# Patient Record
Sex: Male | Born: 1980 | Race: Black or African American | Hispanic: No | Marital: Single | Smoking: Current every day smoker
Health system: Southern US, Community
[De-identification: ages and names within clinical notes are randomized; demographics above are authoritative.]

## PROBLEM LIST (undated history)

## (undated) DIAGNOSIS — I219 Acute myocardial infarction, unspecified: Secondary | ICD-10-CM

## (undated) DIAGNOSIS — W3400XA Accidental discharge from unspecified firearms or gun, initial encounter: Secondary | ICD-10-CM

## (undated) HISTORY — PX: OTHER SURGICAL HISTORY: SHX169

---

## 2010-03-20 ENCOUNTER — Emergency Department (HOSPITAL_COMMUNITY): Admission: AC | Admit: 2010-03-20 | Discharge: 2010-03-20 | Payer: Self-pay

## 2010-07-26 LAB — CBC
HCT: 43 % (ref 39.0–52.0)
Hemoglobin: 14.6 g/dL (ref 13.0–17.0)
MCH: 30.8 pg (ref 26.0–34.0)
MCHC: 34 g/dL (ref 30.0–36.0)
MCV: 90.7 fL (ref 78.0–100.0)
Platelets: 223 10*3/uL (ref 150–400)
RBC: 4.74 MIL/uL (ref 4.22–5.81)
RDW: 13.2 % (ref 11.5–15.5)
WBC: 11.8 10*3/uL — ABNORMAL HIGH (ref 4.0–10.5)

## 2010-07-26 LAB — COMPREHENSIVE METABOLIC PANEL
ALT: 15 U/L (ref 0–53)
AST: 21 U/L (ref 0–37)
Albumin: 4.1 g/dL (ref 3.5–5.2)
Alkaline Phosphatase: 70 U/L (ref 39–117)
BUN: 7 mg/dL (ref 6–23)
Chloride: 106 mEq/L (ref 96–112)
GFR calc Af Amer: >60 mL/min (ref >60)
Potassium: 3.7 mEq/L (ref 3.5–5.1)
Sodium: 139 mEq/L (ref 135–145)
Total Bilirubin: 0.6 mg/dL (ref 0.3–1.2)
Total Protein: 7.5 g/dL (ref 6.0–8.3)

## 2010-07-26 LAB — POCT I-STAT, CHEM 8
BUN: 8 mg/dL (ref 6–23)
Calcium, Ion: 1.05 mmol/L — ABNORMAL LOW (ref 1.12–1.32)
Chloride: 106 meq/L (ref 96–112)
Glucose, Bld: 93 mg/dL (ref 70–99)
HCT: 48 % (ref 39.0–52.0)
Potassium: 3.8 meq/L (ref 3.5–5.1)

## 2010-07-26 LAB — COMPREHENSIVE METABOLIC PANEL WITH GFR
CO2: 26 meq/L (ref 19–32)
Calcium: 9.3 mg/dL (ref 8.4–10.5)
Creatinine, Ser: 0.91 mg/dL (ref 0.4–1.5)
GFR calc non Af Amer: >60 mL/min (ref >60)
Glucose, Bld: 95 mg/dL (ref 70–99)

## 2010-07-26 LAB — PROTIME-INR
INR: 0.92 (ref 0.00–1.49)
Prothrombin Time: 12.6 s (ref 11.6–15.2)

## 2010-07-26 LAB — SAMPLE TO BLOOD BANK

## 2011-10-08 ENCOUNTER — Emergency Department (HOSPITAL_COMMUNITY)
Admission: EM | Admit: 2011-10-08 | Discharge: 2011-10-08 | Disposition: A | Discharge status: Home Health | Payer: Self-pay | Attending: Emergency Medicine | Admitting: Emergency Medicine

## 2011-10-08 ENCOUNTER — Emergency Department (HOSPITAL_COMMUNITY): Payer: Self-pay

## 2011-10-08 ENCOUNTER — Encounter (HOSPITAL_COMMUNITY): Payer: Self-pay | Admitting: Family Medicine

## 2011-10-08 DIAGNOSIS — J189 Pneumonia, unspecified organism: Secondary | ICD-10-CM | POA: Insufficient documentation

## 2011-10-08 DIAGNOSIS — M549 Dorsalgia, unspecified: Secondary | ICD-10-CM | POA: Insufficient documentation

## 2011-10-08 LAB — COMPREHENSIVE METABOLIC PANEL
Alkaline Phosphatase: 65 U/L (ref 39–117)
BUN: 7 mg/dL (ref 6–23)
Chloride: 102 mEq/L (ref 96–112)
Creatinine, Ser: 0.73 mg/dL (ref 0.50–1.35)
GFR calc Af Amer: >90 mL/min (ref >90)
Glucose, Bld: 80 mg/dL (ref 70–99)
Potassium: 4.6 mEq/L (ref 3.5–5.1)
Total Bilirubin: 0.4 mg/dL (ref 0.3–1.2)
Total Protein: 7.6 g/dL (ref 6.0–8.3)

## 2011-10-08 LAB — LIPASE, BLOOD: Lipase: 22 U/L (ref 11–59)

## 2011-10-08 LAB — CBC
HCT: 43.4 % (ref 39.0–52.0)
Hemoglobin: 15 g/dL (ref 13.0–17.0)
MCHC: 34.6 g/dL (ref 30.0–36.0)
MCV: 90.4 fL (ref 78.0–100.0)
RDW: 12.5 % (ref 11.5–15.5)

## 2011-10-08 MED ORDER — HYDROCODONE-ACETAMINOPHEN 5-325 MG PO TABS: 1.0000 | ORAL_TABLET | ORAL | Status: AC | PRN

## 2011-10-08 MED ORDER — SODIUM CHLORIDE 0.9 % IV BOLUS (SEPSIS)
1000.0000 mL | Freq: Once | INTRAVENOUS | Status: AC
  Administered 2011-10-08: 1000 mL via INTRAVENOUS

## 2011-10-08 MED ORDER — AZITHROMYCIN 250 MG PO TABS
500.0000 mg | ORAL_TABLET | Freq: Once | ORAL | Status: AC
  Administered 2011-10-08: 500 mg via ORAL
  Filled 2011-10-08: qty 2

## 2011-10-08 MED ORDER — HYDROMORPHONE HCL PF 1 MG/ML IJ SOLN
1.0000 mg | Freq: Once | INTRAMUSCULAR | Status: AC
  Administered 2011-10-08: 1 mg via INTRAVENOUS
  Filled 2011-10-08: qty 1

## 2011-10-08 MED ORDER — AZITHROMYCIN 250 MG PO TABS: 250.0000 mg | ORAL_TABLET | Freq: Every day | ORAL | Status: AC

## 2011-10-08 MED ORDER — KETOROLAC TROMETHAMINE 30 MG/ML IJ SOLN: 30.0000 mg | Freq: Once | INTRAMUSCULAR | Status: DC

## 2011-10-08 NOTE — ED Notes (Signed)
Report given to Zina, RN.

## 2011-10-08 NOTE — Discharge Instructions (Signed)
Your x-ray showed that you likely have pneumonia. You have been given an antibiotic to treat this. Your back pain may be coming from the pneumonia in her chest. You've been given a pain medication to take at home. Ensure to drink plenty of fluids over the next several days. You can alternate Tylenol and Motrin every 4 hours as needed for fever. If you have a high fever not controlled by medication, worsening pain, shortness of breath, or chest pain, or any other worrisome symptoms, please return to the emergency department.  RESOURCE GUIDE  Dental Problems  Patients with Medicaid: [facility] [phone] [address]                                           [phone] Phone:  [phone]                                                  Phone:  515-664-8672  If unable to pay or uninsured, contact:  [facility] or [facility]. to become qualified for the adult dental clinic.  Chronic Pain Problems Contact [facility]  [phone] Patients need to be referred by their primary care doctor.  Insufficient Money for Medicine Contact [employer]:  call "211" or [facility] [phone].  No Primary Care Doctor Call [facility]  820-711-6794 Other agencies that provide inexpensive medical care    [facility]  936-077-9344    [facility]  [phone]    [facility]  7143097961    [facility]  769-620-9888    [facility]  ([phone]    [facility]  [phone]  Psychological Services [facility]  [phone] [facility] Services  9370622371 [facility] Mental [facility]   [phone] (emergency services 6188200614)  Substance Abuse Resources [facility]  947-583-7429 [facility] (989) 289-1886 [facility] 5518174510 Floydene Flock 740-710-9027 Residential & Outpatient Substance Abuse Program   (647)052-9309  Abuse/Neglect [facility] Child Abuse Hotline 251-226-7245 [facility] Child Abuse Hotline 920-051-2841 (After Hours)  Emergency Shelter [facility] 213-398-3085  [facility] (725)707-5346 [facility] 432-677-5533  MRSA Hotline #:   626-044-2075    [facility] Resources  [facility]     [employer]                          [facility] Dept. [address] [place]                       [address]      [address]  Patrecia Pace  Michell Heinrich Phone:  [phone]                                   Phone:  [phone]                 Phone:  ([phone]  [facility] Mental Health Phone:  725-718-1414  [facility] Child Abuse Hotline (907)862-0842 (506)822-2252 (After Hours)Pneumonia, Adult Pneumonia is an infection of the lungs.  CAUSES Pneumonia may be caused by bacteria or a virus. Usually, these infections are caused by breathing infectious particles into the lungs (respiratory tract). SYMPTOMS   Cough.   Fever.   Chest pain.   Increased rate of breathing.   Wheezing.   Mucus production.  DIAGNOSIS  If you have the common symptoms of pneumonia, your caregiver will typically confirm the diagnosis with a chest X-ray. The X-ray will show an abnormality in the lung (pulmonary infiltrate) if you have pneumonia. Other tests of your blood, urine, or sputum may be done to find the specific cause of your pneumonia. Your caregiver may also do tests (blood gases or pulse oximetry) to see how well your lungs are working. TREATMENT  Some forms of pneumonia may be spread to other people when you cough or sneeze. You may be asked to wear a mask before and during your exam. Pneumonia that is caused by bacteria is treated with antibiotic medicine. Pneumonia that is  caused by the influenza virus may be treated with an antiviral medicine. Most other viral infections must run their course. These infections will not respond to antibiotics.  PREVENTION A pneumococcal shot (vaccine) is available to prevent a common bacterial cause of pneumonia. This is usually suggested for:  People over 71 years old.   Patients on chemotherapy.   People with chronic lung problems, such as bronchitis or emphysema.   People with immune system problems.  If you are over 65 or have a high risk condition, you may receive the pneumococcal vaccine if you have not received it before. In some countries, a routine influenza vaccine is also recommended. This vaccine can help prevent some cases of pneumonia.You may be offered the influenza vaccine as part of your care. If you smoke, it is time to quit. You may receive instructions on how to stop smoking. Your caregiver can provide medicines and counseling to help you quit. HOME CARE INSTRUCTIONS   Cough suppressants may be used if you are losing too much rest. However, coughing protects you by clearing your lungs. You should avoid using cough suppressants if you can.   Your caregiver may have prescribed medicine if he or she thinks your pneumonia is caused by a bacteria or influenza. Finish your medicine even if you start to feel better.   Your caregiver may also prescribe an expectorant. This loosens the mucus to be coughed up.   Only take over-the-counter or prescription medicines for pain, discomfort, or fever as directed by your caregiver.   Do not smoke. Smoking is a common cause of bronchitis and can contribute to pneumonia. If you are a smoker and continue to smoke, your cough may last several weeks after your pneumonia has cleared.   A cold steam vaporizer or humidifier in your room or home may help loosen mucus.   Coughing is often worse at night. Sleeping in a semi-upright position in a recliner or using a couple pillows  under your head will help  with this.   Get rest as you feel it is needed. Your body will usually let you know when you need to rest.  SEEK IMMEDIATE MEDICAL CARE IF:   Your illness becomes worse. This is especially true if you are elderly or weakened from any other disease.   You cannot control your cough with suppressants and are losing sleep.   You begin coughing up blood.   You develop pain which is getting worse or is uncontrolled with medicines.   You have a fever.   Any of the symptoms which initially brought you in for treatment are getting worse rather than better.   You develop shortness of breath or chest pain.  MAKE SURE YOU:   Understand these instructions.   Will watch your condition.   Will get help right away if you are not doing well or get worse.  Document Released: 05/01/2005 Document Revised: 04/20/2011 Document Reviewed: 07/21/2010 [facility] Patient Information 2012 [place], [place].

## 2011-10-08 NOTE — ED Notes (Signed)
Pt sts started feeling bad Thursday night with flu like symptoms. sts some vomiting.

## 2011-10-08 NOTE — ED Notes (Signed)
Discharge instructions reviewed with pt. Verbalizes understanding.  No questions asked; No further c/o's voiced.  Pt to lobby via wheelchair.  NAD noted.  Friend at side to escort home.

## 2011-10-08 NOTE — ED Provider Notes (Signed)
1:24 PM Care assumed of the patient in the CDU. Patient presented with back pain, flulike symptoms, fever, and nausea, which started on Thursday and have been progressively worsening since that time. His labs are remarkable for mild leukocytosis of 13,000. Chest x-ray, which I personally reviewed shows likely right middle lobe pneumonia. We'll treat him for this. Anticipate he can be treated for community-acquired pneumonia as an outpatient as his vital signs are stable and he is not hypoxic on room air. We'll repeat his fluid bolus and give him his first dose of antibiotics prior to discharge.  4:22 PM Patient feeling better after 2L of fluids. Sats remain stable. Discharged home with prescription for Zithromax with first dose given here. Reasons to return to ED discussed. Pt and family at bedside verbalized understanding and agreed with plan.  Grant Fontana, [place] 10/08/11 1623

## 2011-10-08 NOTE — ED Provider Notes (Signed)
I was the primary provider of this patient during this ER visit. The patients care was continued in the CDU and managed in conjunction with my midlevel providers   Lyanne Co, MD 10/08/11 [phone]

## 2011-10-08 NOTE — ED Provider Notes (Signed)
History   This chart was scribed for Lyanne Co, MD by Toya Smothers. The patient was seen in room STRE4/STRE4. Patient's care was started at 1145.  CSN: [id number]  Arrival date & time 10/08/11  1145   First MD Initiated Contact with Patient 10/08/11 1154    Chief Complaint  Patient presents with  . Back Pain  . Fever  . Cough    HPI  Phillip [name] is a 31 y.o. male who presents to the Emergency Department complaining of graudual onset moderate severe constant fever  Onset 4 days ago with associated coughing, chills, and abdominal pain denying SOB, sore throat, nasal congestion, nausea, dysuria, constipation. Pt also c/o gradual onset moderate severe radiating back pain onset 4 days ago with no associate symptoms. Pt states that he is an occasional user of alcohol and also a current everyday smoker.    History reviewed. No pertinent past medical history.  History reviewed. No pertinent past surgical history.  History reviewed. No pertinent family history.  History  Substance Use Topics  . Smoking status: Current Everyday Smoker  . Smokeless tobacco: Not on file  . Alcohol Use: Yes      Review of Systems  Constitutional: Positive for fever and chills.  HENT: Negative for congestion, rhinorrhea and neck pain.   Respiratory: Positive for cough. Negative for shortness of breath.   Gastrointestinal: Positive for abdominal pain. Negative for nausea, vomiting, diarrhea, constipation and blood in stool.  Neurological: Negative for weakness.   A complete 10 system review of systems was obtained and all systems are negative except as noted in the HPI and PMH.  Ros  Allergies  Review of patient's allergies indicates no known allergies.  Home Medications   Current Outpatient Rx  Name Route Sig Dispense Refill  . GUAIFENESIN 100 MG/5ML PO SYRP Oral Take 200 mg by mouth 3 (three) times daily as needed.    . IBUPROFEN 200 MG PO TABS Oral Take 200 mg by mouth every 6 (six)  hours as needed.    . MENTHOL 3 MG MT LOZG Oral Take 1 lozenge by mouth as needed.    Doreatha Martin D COLD/FLU PO Oral Take 1 capsule by mouth every 6 (six) hours as needed. Cough, acetaminophen 325/dextromethorphan 15/doxylamine 15    . SENNA 8.6 MG PO TABS Oral Take 1 tablet by mouth.      BP 151/103  Pulse 68  Temp(Src) 98.6 F (37 C) (Oral)  Resp 18  SpO2 98%  Physical Exam  Nursing note and vitals reviewed. Constitutional: He is oriented to person, place, and time. He appears well-developed and well-nourished. No distress.  HENT:  Head: Normocephalic and atraumatic.  Eyes: EOM are normal. Pupils are equal, round, and reactive to light.  Neck: Neck supple. No tracheal deviation present.  Cardiovascular: Normal rate.   Pulmonary/Chest: Effort normal. No respiratory distress.  Abdominal: Soft. He exhibits no distension.       Tenderness in epigastric in RUQ  Musculoskeletal: Normal range of motion. He exhibits no edema.  Neurological: He is alert and oriented to person, place, and time. No sensory deficit.  Skin: Skin is warm and dry.  Psychiatric: He has a normal mood and affect. His behavior is normal.    ED Course  Procedures (including critical care time) DIAGNOSTIC STUDIES: Oxygen Saturation is 98% on room air, normal by my interpretation.    COORDINATION OF CARE: 12:05PM- Ordered blood work and radiology   Labs Reviewed  CBC  COMPREHENSIVE  METABOLIC PANEL  LIPASE, BLOOD   No results found.   No diagnosis found.    MDM   the patient's pain will be treated.  UA hydrated in the emergency department.  Given his discomfort unless of his chest the chest x-ray will be obtained.  CBC CMP and lipase are pending at this time.  The patient's pain will be treated.  He'll continue his care in the clinical decision unit.  I will continue tobe  involved in his care  I personally performed the services described in this documentation, which was scribed in my presence. The  recorded information has been reviewed and considered.          Lyanne Co, MD 10/08/11 [phone]

## 2012-01-25 ENCOUNTER — Emergency Department (HOSPITAL_COMMUNITY): Payer: No Typology Code available for payment source

## 2012-01-25 ENCOUNTER — Emergency Department (HOSPITAL_COMMUNITY)
Admission: EM | Admit: 2012-01-25 | Discharge: 2012-01-26 | Disposition: A | Discharge status: Home / Self Care | Payer: No Typology Code available for payment source | Attending: Emergency Medicine | Admitting: Emergency Medicine

## 2012-01-25 ENCOUNTER — Encounter (HOSPITAL_COMMUNITY): Payer: Self-pay | Admitting: *Deleted

## 2012-01-25 DIAGNOSIS — S335XXA Sprain of ligaments of lumbar spine, initial encounter: Secondary | ICD-10-CM | POA: Insufficient documentation

## 2012-01-25 DIAGNOSIS — S39012A Strain of muscle, fascia and tendon of lower back, initial encounter: Secondary | ICD-10-CM

## 2012-01-25 DIAGNOSIS — F17200 Nicotine dependence, unspecified, uncomplicated: Secondary | ICD-10-CM | POA: Insufficient documentation

## 2012-01-25 MED ORDER — DIAZEPAM 5 MG PO TABS
5.0000 mg | ORAL_TABLET | Freq: Once | ORAL | Status: AC
  Administered 2012-01-25: 5 mg via ORAL
  Filled 2012-01-25: qty 1

## 2012-01-25 MED ORDER — IBUPROFEN 800 MG PO TABS: 800.0000 mg | ORAL_TABLET | Freq: Three times a day (TID) | ORAL | Status: DC | PRN

## 2012-01-25 MED ORDER — HYDROCODONE-ACETAMINOPHEN 5-325 MG PO TABS
1.0000 | ORAL_TABLET | Freq: Once | ORAL | Status: AC
  Administered 2012-01-25: 1 via ORAL
  Filled 2012-01-25: qty 1

## 2012-01-25 MED ORDER — CYCLOBENZAPRINE HCL 10 MG PO TABS: 10.0000 mg | ORAL_TABLET | Freq: Three times a day (TID) | ORAL | Status: DC | PRN
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
## 2012-01-27 NOTE — ED Provider Notes (Signed)
Medical screening examination/treatment/procedure(s) were performed by non-physician practitioner and as supervising physician I was immediately available for consultation/collaboration.  Cyndra Numbers, MD 01/27/12 1431

## 2012-01-28 ENCOUNTER — Emergency Department (HOSPITAL_COMMUNITY): Payer: No Typology Code available for payment source

## 2012-01-28 ENCOUNTER — Encounter (HOSPITAL_COMMUNITY): Payer: Self-pay | Admitting: Emergency Medicine

## 2012-01-28 ENCOUNTER — Emergency Department (HOSPITAL_COMMUNITY)
Admission: EM | Admit: 2012-01-28 | Discharge: 2012-01-28 | Disposition: A | Discharge status: Home / Self Care | Payer: No Typology Code available for payment source | Attending: Emergency Medicine | Admitting: Emergency Medicine

## 2012-01-28 DIAGNOSIS — G44309 Post-traumatic headache, unspecified, not intractable: Secondary | ICD-10-CM

## 2012-01-28 DIAGNOSIS — R42 Dizziness and giddiness: Secondary | ICD-10-CM | POA: Insufficient documentation

## 2012-01-28 DIAGNOSIS — M549 Dorsalgia, unspecified: Secondary | ICD-10-CM | POA: Insufficient documentation

## 2012-01-28 DIAGNOSIS — M542 Cervicalgia: Secondary | ICD-10-CM | POA: Insufficient documentation

## 2012-01-28 MED ORDER — PROMETHAZINE HCL 25 MG PO TABS: 25.0000 mg | ORAL_TABLET | Freq: Four times a day (QID) | ORAL | Status: DC | PRN

## 2012-01-28 MED ORDER — METHOCARBAMOL 500 MG PO TABS
1000.0000 mg | ORAL_TABLET | Freq: Once | ORAL | Status: AC
  Administered 2012-01-28: 1000 mg via ORAL
  Filled 2012-01-28: qty 2

## 2012-01-28 MED ORDER — KETOROLAC TROMETHAMINE 60 MG/2ML IM SOLN
60.0000 mg | Freq: Once | INTRAMUSCULAR | Status: AC
  Administered 2012-01-28: 60 mg via INTRAMUSCULAR
  Filled 2012-01-28: qty 2

## 2012-01-28 MED ORDER — OXYCODONE-ACETAMINOPHEN 5-325 MG PO TABS: 1.0000 | ORAL_TABLET | ORAL | Status: AC | PRN

## 2012-01-28 NOTE — ED Provider Notes (Signed)
Medical screening examination/treatment/procedure(s) were performed by non-physician practitioner and as supervising physician I was immediately available for consultation/collaboration.    Lemario Chaikin, MD 01/28/12 1614 
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
## 2012-04-27 ENCOUNTER — Emergency Department (HOSPITAL_COMMUNITY)
Admission: EM | Admit: 2012-04-27 | Discharge: 2012-04-27 | Disposition: A | Discharge status: Home / Self Care | Payer: No Typology Code available for payment source | Attending: Emergency Medicine | Admitting: Emergency Medicine

## 2012-04-27 ENCOUNTER — Encounter (HOSPITAL_COMMUNITY): Payer: Self-pay | Admitting: *Deleted

## 2012-04-27 DIAGNOSIS — R42 Dizziness and giddiness: Secondary | ICD-10-CM | POA: Insufficient documentation

## 2012-04-27 DIAGNOSIS — M545 Low back pain, unspecified: Secondary | ICD-10-CM | POA: Insufficient documentation

## 2012-04-27 DIAGNOSIS — Y939 Activity, unspecified: Secondary | ICD-10-CM | POA: Insufficient documentation

## 2012-04-27 DIAGNOSIS — S335XXA Sprain of ligaments of lumbar spine, initial encounter: Secondary | ICD-10-CM | POA: Insufficient documentation

## 2012-04-27 DIAGNOSIS — S39012A Strain of muscle, fascia and tendon of lower back, initial encounter: Secondary | ICD-10-CM

## 2012-04-27 DIAGNOSIS — F17200 Nicotine dependence, unspecified, uncomplicated: Secondary | ICD-10-CM | POA: Insufficient documentation

## 2012-04-27 DIAGNOSIS — Y92410 Unspecified street and highway as the place of occurrence of the external cause: Secondary | ICD-10-CM | POA: Insufficient documentation

## 2012-04-27 MED ORDER — TRAMADOL HCL 50 MG PO TABS: 50.0000 mg | ORAL_TABLET | Freq: Four times a day (QID) | ORAL | Status: DC | PRN
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
## 2012-11-27 ENCOUNTER — Emergency Department (HOSPITAL_COMMUNITY)
Admission: EM | Admit: 2012-11-27 | Discharge: 2012-11-27 | Disposition: A | Discharge status: Home / Self Care | Payer: No Typology Code available for payment source | Attending: Emergency Medicine | Admitting: Emergency Medicine

## 2012-11-27 ENCOUNTER — Encounter (HOSPITAL_COMMUNITY): Payer: Self-pay | Admitting: Emergency Medicine

## 2012-11-27 ENCOUNTER — Emergency Department (HOSPITAL_COMMUNITY): Payer: No Typology Code available for payment source

## 2012-11-27 DIAGNOSIS — S161XXA Strain of muscle, fascia and tendon at neck level, initial encounter: Secondary | ICD-10-CM

## 2012-11-27 DIAGNOSIS — S6990XA Unspecified injury of unspecified wrist, hand and finger(s), initial encounter: Secondary | ICD-10-CM | POA: Insufficient documentation

## 2012-11-27 DIAGNOSIS — S59909A Unspecified injury of unspecified elbow, initial encounter: Secondary | ICD-10-CM | POA: Insufficient documentation

## 2012-11-27 DIAGNOSIS — S5002XA Contusion of left elbow, initial encounter: Secondary | ICD-10-CM

## 2012-11-27 DIAGNOSIS — S5000XA Contusion of unspecified elbow, initial encounter: Secondary | ICD-10-CM | POA: Insufficient documentation

## 2012-11-27 DIAGNOSIS — Y92410 Unspecified street and highway as the place of occurrence of the external cause: Secondary | ICD-10-CM | POA: Insufficient documentation

## 2012-11-27 DIAGNOSIS — Y9389 Activity, other specified: Secondary | ICD-10-CM | POA: Insufficient documentation

## 2012-11-27 DIAGNOSIS — S139XXA Sprain of joints and ligaments of unspecified parts of neck, initial encounter: Secondary | ICD-10-CM | POA: Insufficient documentation

## 2012-11-27 DIAGNOSIS — F17200 Nicotine dependence, unspecified, uncomplicated: Secondary | ICD-10-CM | POA: Insufficient documentation

## 2012-11-27 MED ORDER — IBUPROFEN 800 MG PO TABS: 800.0000 mg | ORAL_TABLET | Freq: Three times a day (TID) | ORAL | Status: DC | PRN

## 2012-11-27 MED ORDER — HYDROCODONE-ACETAMINOPHEN 5-325 MG PO TABS: 1.0000 | ORAL_TABLET | Freq: Four times a day (QID) | ORAL | Status: DC | PRN

## 2012-11-27 MED ORDER — HYDROCODONE-ACETAMINOPHEN 5-325 MG PO TABS
1.0000 | ORAL_TABLET | Freq: Once | ORAL | Status: AC
  Administered 2012-11-27: 1 via ORAL
  Filled 2012-11-27: qty 1
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
## 2012-11-27 NOTE — ED Provider Notes (Signed)
Medical screening examination/treatment/procedure(s) were performed by non-physician practitioner and as supervising physician I was immediately available for consultation/collaboration.  Charlean Carneal, MD 11/27/12 2338 
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
## 2015-08-02 ENCOUNTER — Encounter (HOSPITAL_COMMUNITY): Payer: Self-pay | Admitting: Emergency Medicine

## 2015-08-02 ENCOUNTER — Emergency Department (HOSPITAL_COMMUNITY)
Admission: EM | Admit: 2015-08-02 | Discharge: 2015-08-02 | Disposition: A | Discharge status: Home / Self Care | Payer: No Typology Code available for payment source | Attending: Emergency Medicine | Admitting: Emergency Medicine

## 2015-08-02 DIAGNOSIS — R300 Dysuria: Secondary | ICD-10-CM | POA: Insufficient documentation

## 2015-08-02 DIAGNOSIS — Z202 Contact with and (suspected) exposure to infections with a predominantly sexual mode of transmission: Secondary | ICD-10-CM | POA: Insufficient documentation

## 2015-08-02 DIAGNOSIS — R369 Urethral discharge, unspecified: Secondary | ICD-10-CM | POA: Insufficient documentation

## 2015-08-02 DIAGNOSIS — Z711 Person with feared health complaint in whom no diagnosis is made: Secondary | ICD-10-CM

## 2015-08-02 DIAGNOSIS — F17200 Nicotine dependence, unspecified, uncomplicated: Secondary | ICD-10-CM | POA: Insufficient documentation

## 2015-08-02 LAB — URINALYSIS, ROUTINE W REFLEX MICROSCOPIC
Bilirubin Urine: NEGATIVE
Glucose, UA: NEGATIVE mg/dL
Hgb urine dipstick: NEGATIVE
KETONES UR: NEGATIVE mg/dL
NITRITE: NEGATIVE
PROTEIN: 30 mg/dL — AB
Specific Gravity, Urine: 1.026 (ref 1.005–1.030)
pH: 7.5 (ref 5.0–8.0)

## 2015-08-02 LAB — URINE MICROSCOPIC-ADD ON

## 2015-08-02 MED ORDER — AZITHROMYCIN 250 MG PO TABS
1000.0000 mg | ORAL_TABLET | Freq: Once | ORAL | Status: AC
  Administered 2015-08-02: 1000 mg via ORAL
  Filled 2015-08-02: qty 4

## 2015-08-02 MED ORDER — CEFTRIAXONE SODIUM 250 MG IJ SOLR
250.0000 mg | Freq: Once | INTRAMUSCULAR | Status: AC
  Administered 2015-08-02: 250 mg via INTRAMUSCULAR
  Filled 2015-08-02: qty 250

## 2015-08-02 MED ORDER — CEPHALEXIN 500 MG PO CAPS: 500.0000 mg | ORAL_CAPSULE | Freq: Two times a day (BID) | ORAL | Status: DC

## 2015-08-02 MED ORDER — STERILE WATER FOR INJECTION IJ SOLN
INTRAMUSCULAR | Status: AC
  Filled 2015-08-02: qty 10

## 2015-08-02 NOTE — Discharge Instructions (Signed)
1. Medications: keflex, usual home medications °2. Treatment: rest, drink plenty of fluids  °3. Follow Up: please followup with your primary doctor for discussion of your diagnoses and further evaluation after today's visit; if you do not have a primary care doctor use the phone number listed in your discharge paperwork to find one; please return to the ER for high fever, severe abdominal pain, new or worsening symptoms ° ° ° °

## 2015-08-02 NOTE — ED Notes (Signed)
Pt reports brown penile discharge and dysuria x 2 days. denies back pain . Reports unprotected sexual intercourse last week. denies rash nor fever. Alert and oriented x 4.

## 2015-08-02 NOTE — ED Provider Notes (Signed)
CSN: [id number]     Arrival date & time 08/02/15  1246 History  By signing my name below, I, Placido SouLogan Joldersma, attest that this documentation has been prepared under the direction and in the presence of Helen Winterhalter C. Khaleb Broz, PA-C. Electronically Signed: Placido SouLogan Joldersma, ED Scribe. 08/02/2015. 2:13 PM.   Chief Complaint  Patient presents with  . Dysuria  . Penile Discharge    The history is provided by the patient. No language interpreter was used.     HPI Comments: Phillip [name] is a 35 y.o. male who is otherwise healthy presents to the Emergency Department complaining of constant, moderate, dysuria onset 2 days ago. Pt notes having unprotected sexual intercourse with 1 male partner 1 week ago. He reports associated brown penile discharge. Pt denies any worsening or alleviating factors. He denies a PMHx of STDs. Pt denies abd pain, n/v/d, rectal pain, rash, penile pain, penile swelling, testicular swelling, testicular pain or any other associated symptoms at this time.   History reviewed. No pertinent past medical history. History reviewed. No pertinent past surgical history. No family history on file. Social History  Substance Use Topics  . Smoking status: Current Every Day Smoker  . Smokeless tobacco: None  . Alcohol Use: Yes     Comment: occasional      Review of Systems  Gastrointestinal: Negative for nausea, vomiting, abdominal pain, diarrhea and rectal pain.  Genitourinary: Positive for dysuria and discharge. Negative for penile swelling, scrotal swelling, genital sores, penile pain and testicular pain.  Skin: Negative for rash.    Allergies  Review of patient's allergies indicates no known allergies.  Home Medications   Prior to Admission medications   Medication Sig Start Date End Date Taking? Authorizing Provider  cephALEXin (KEFLEX) 500 MG capsule Take 1 capsule (500 mg total) by mouth 2 (two) times daily. 08/02/15   Mady GemmaElizabeth C Therron Sells, PA-C   HYDROcodone-acetaminophen (NORCO/VICODIN) 5-325 MG per tablet Take 1 tablet by mouth every 6 (six) hours as needed for pain. 11/27/12   Charlestine Nighthristopher Lawyer, PA-C  HYDROcodone-acetaminophen (VICODIN) 5-500 MG per tablet Take 1 tablet by mouth every 6 (six) hours as needed for pain.    Historical Provider, MD  ibuprofen (ADVIL,MOTRIN) 800 MG tablet Take 1 tablet (800 mg total) by mouth every 8 (eight) hours as needed for pain. 11/27/12   Christopher Lawyer, PA-C    BP 127/65 mmHg  Pulse 72  Temp(Src) 97.9 F (36.6 C) (Oral)  Resp 16  SpO2 97% Physical Exam  Constitutional: He is oriented to person, place, and time. He appears well-developed and well-nourished. No distress.  HENT:  Head: Normocephalic and atraumatic.  Right Ear: External ear normal.  Left Ear: External ear normal.  Nose: Nose normal.  Mouth/Throat: Oropharynx is clear and moist. No oropharyngeal exudate.  Eyes: Conjunctivae and EOM are normal. Pupils are equal, round, and reactive to light. Right eye exhibits no discharge. Left eye exhibits no discharge. No scleral icterus.  Neck: Normal range of motion. Neck supple.  Cardiovascular: Normal rate and regular rhythm.   Pulmonary/Chest: Effort normal and breath sounds normal. No respiratory distress.  Abdominal: Soft. Bowel sounds are normal. He exhibits no distension and no mass. There is no tenderness. There is no rebound and no guarding.  Genitourinary: Testes normal. Right testis shows no mass, no swelling and no tenderness. Left testis shows no mass, no swelling and no tenderness. No penile erythema or penile tenderness. Discharge found.  Chaperone present  Musculoskeletal: Normal range of motion. He exhibits  no edema or tenderness.  Lymphadenopathy:       Right: No inguinal adenopathy present.       Left: No inguinal adenopathy present.  Neurological: He is alert and oriented to person, place, and time.  Skin: Skin is warm and dry. He is not diaphoretic.  Psychiatric:  He has a normal mood and affect. His behavior is normal.  Nursing note and vitals reviewed.  ED Course  Procedures   DIAGNOSTIC STUDIES: Oxygen Saturation is 97% on RA, normal by my interpretation.    COORDINATION OF CARE: 2:12 PM Discussed next steps with pt. He verbalized understanding and is agreeable with the plan.   Labs Review Labs Reviewed  URINALYSIS, ROUTINE W REFLEX MICROSCOPIC (NOT AT [facility]) - Abnormal; Notable for the following:    APPearance TURBID (*)    Protein, ur 30 (*)    Leukocytes, UA MODERATE (*)    All other components within normal limits  URINE MICROSCOPIC-ADD ON - Abnormal; Notable for the following:    Squamous Epithelial / LPF 0-5 (*)    Bacteria, UA FEW (*)    All other components within normal limits  URINE CULTURE  GC/CHLAMYDIA PROBE AMP ([place]) NOT AT [facility]    Imaging Review No results found.   I have personally reviewed and evaluated these lab results as part of my medical decision-making.   EKG Interpretation None      MDM   Final diagnoses:  Concern about STD in male without diagnosis  Dysuria    35 year old male presents with dysuria and penile discharge x 2 days. Reports unprotected sexual intercourse last week. Denies abdominal pain, N/V, penile pain/swelling, testicular pain/swelling. Patient is afebrile. Vital signs stable. Abdomen soft, non-tender, non-distended. Small amount of penile discharge present on exam. No TTP of penis or testes. Will obtain UA and GC/chlamydia. UA remarkable for moderate leukocytes, TNTC WBC, few bacteria. Urine culture ordered. Will treat with keflex. Given patient is symptomatic, will treat with rocephin and azithromycin. Discussed importance of using protection while sexually active. Patient understands he has gc/chlamydia cultures pending and to inform all sexual partners if results return positive. Return precautions discussed. Patient verbalizes his understanding and is in agreement with  plan.  BP 127/65 mmHg  Pulse 72  Temp(Src) 97.9 F (36.6 C) (Oral)  Resp 16  SpO2 97%  I personally performed the services described in this documentation, which was scribed in my presence. The recorded information has been reviewed and is accurate.    Mady Gemma, PA-C 08/02/15 1503  Azalia Bilis, MD 08/02/15 1520

## 2015-08-03 LAB — GC/CHLAMYDIA PROBE AMP (~~LOC~~) NOT AT ARMC
CHLAMYDIA, DNA PROBE: NEGATIVE
Neisseria Gonorrhea: POSITIVE — AB

## 2015-08-03 LAB — URINE CULTURE

## 2015-08-04 ENCOUNTER — Telehealth (HOSPITAL_COMMUNITY): Payer: Self-pay

## 2015-08-04 NOTE — Telephone Encounter (Signed)
[facility]  (+) gonorrhea.  Treated with Zithromax and Rocephin.  Pt ID verified.  Pt informed of dx, tx rcvd appropriate, notify partner & abstain from sex x 10 days.  [facility] form completed and faxed.

## 2015-09-09 ENCOUNTER — Emergency Department (HOSPITAL_COMMUNITY)
Admission: EM | Admit: 2015-09-09 | Discharge: 2015-09-09 | Disposition: A | Discharge status: Home / Self Care | Payer: Self-pay | Attending: Emergency Medicine | Admitting: Emergency Medicine

## 2015-09-09 ENCOUNTER — Encounter (HOSPITAL_COMMUNITY): Payer: Self-pay | Admitting: *Deleted

## 2015-09-09 ENCOUNTER — Emergency Department (HOSPITAL_COMMUNITY): Payer: No Typology Code available for payment source

## 2015-09-09 DIAGNOSIS — Z87828 Personal history of other (healed) physical injury and trauma: Secondary | ICD-10-CM | POA: Insufficient documentation

## 2015-09-09 DIAGNOSIS — R0789 Other chest pain: Secondary | ICD-10-CM | POA: Insufficient documentation

## 2015-09-09 DIAGNOSIS — F17200 Nicotine dependence, unspecified, uncomplicated: Secondary | ICD-10-CM | POA: Insufficient documentation

## 2015-09-09 HISTORY — DX: Accidental discharge from unspecified firearms or gun, initial encounter: W34.00XA

## 2015-09-09 LAB — BASIC METABOLIC PANEL
ANION GAP: 8 (ref 5–15)
BUN: 9 mg/dL (ref 6–20)
CALCIUM: 8.7 mg/dL — AB (ref 8.9–10.3)
CO2: 24 mmol/L (ref 22–32)
CREATININE: 0.88 mg/dL (ref 0.61–1.24)
Chloride: 108 mmol/L (ref 101–111)
GFR calc non Af Amer: >60 mL/min (ref >60)
Glucose, Bld: 105 mg/dL — ABNORMAL HIGH (ref 65–99)
Potassium: 4 mmol/L (ref 3.5–5.1)
SODIUM: 140 mmol/L (ref 135–145)

## 2015-09-09 LAB — CBC
HCT: 40.3 % (ref 39.0–52.0)
HEMOGLOBIN: 13.4 g/dL (ref 13.0–17.0)
MCH: 30.4 pg (ref 26.0–34.0)
MCHC: 33.3 g/dL (ref 30.0–36.0)
MCV: 91.4 fL (ref 78.0–100.0)
PLATELETS: 215 10*3/uL (ref 150–400)
RBC: 4.41 MIL/uL (ref 4.22–5.81)
RDW: 13 % (ref 11.5–15.5)
WBC: 7.3 10*3/uL (ref 4.0–10.5)

## 2015-09-09 LAB — I-STAT TROPONIN, ED: TROPONIN I, POC: 0 ng/mL (ref 0.00–0.08)

## 2015-09-09 MED ORDER — TRAMADOL HCL 50 MG PO TABS: 50.0000 mg | ORAL_TABLET | Freq: Four times a day (QID) | ORAL | Status: DC | PRN

## 2015-09-09 MED ORDER — IBUPROFEN 800 MG PO TABS: 800.0000 mg | ORAL_TABLET | Freq: Three times a day (TID) | ORAL | Status: DC

## 2015-09-09 MED ORDER — KETOROLAC TROMETHAMINE 30 MG/ML IJ SOLN
30.0000 mg | Freq: Once | INTRAMUSCULAR | Status: AC
  Administered 2015-09-09: 30 mg via INTRAVENOUS
  Filled 2015-09-09: qty 1
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
## 2015-09-09 NOTE — ED Notes (Signed)
Pt to ED by [facility] c/o chest pain x 2 hours, reports centralized chest pain radiating to back. EMS gave 324mg  ASA and nitro x 1 without relief, VS 138/84, HR 53

## 2015-09-09 NOTE — Discharge Instructions (Signed)

## 2016-07-26 ENCOUNTER — Emergency Department (HOSPITAL_COMMUNITY)
Admission: EM | Admit: 2016-07-26 | Discharge: 2016-07-26 | Disposition: A | Discharge status: Home / Self Care | Payer: Managed Care, Other (non HMO) | Attending: Emergency Medicine | Admitting: Emergency Medicine

## 2016-07-26 ENCOUNTER — Emergency Department (HOSPITAL_COMMUNITY): Payer: Managed Care, Other (non HMO)

## 2016-07-26 DIAGNOSIS — M546 Pain in thoracic spine: Secondary | ICD-10-CM | POA: Diagnosis not present

## 2016-07-26 DIAGNOSIS — F17200 Nicotine dependence, unspecified, uncomplicated: Secondary | ICD-10-CM | POA: Diagnosis not present

## 2016-07-26 DIAGNOSIS — R0789 Other chest pain: Secondary | ICD-10-CM | POA: Diagnosis not present

## 2016-07-26 DIAGNOSIS — Z79899 Other long term (current) drug therapy: Secondary | ICD-10-CM | POA: Diagnosis not present

## 2016-07-26 LAB — CBC
HCT: 42.3 % (ref 39.0–52.0)
Hemoglobin: 14.5 g/dL (ref 13.0–17.0)
MCH: 31 pg (ref 26.0–34.0)
MCHC: 34.3 g/dL (ref 30.0–36.0)
MCV: 90.6 fL (ref 78.0–100.0)
PLATELETS: 238 10*3/uL (ref 150–400)
RBC: 4.67 MIL/uL (ref 4.22–5.81)
RDW: 13.4 % (ref 11.5–15.5)
WBC: 7.5 10*3/uL (ref 4.0–10.5)

## 2016-07-26 LAB — BASIC METABOLIC PANEL
Anion gap: 7 (ref 5–15)
BUN: 11 mg/dL (ref 6–20)
CALCIUM: 9.1 mg/dL (ref 8.9–10.3)
CHLORIDE: 104 mmol/L (ref 101–111)
CO2: 25 mmol/L (ref 22–32)
CREATININE: 1.03 mg/dL (ref 0.61–1.24)
Glucose, Bld: 97 mg/dL (ref 65–99)
Potassium: 3.6 mmol/L (ref 3.5–5.1)
SODIUM: 136 mmol/L (ref 135–145)

## 2016-07-26 LAB — I-STAT TROPONIN, ED: TROPONIN I, POC: 0 ng/mL (ref 0.00–0.08)

## 2016-07-26 MED ORDER — CYCLOBENZAPRINE HCL 10 MG PO TABS: 10.0000 mg | ORAL_TABLET | Freq: Two times a day (BID) | ORAL | 0 refills | Status: DC | PRN

## 2016-07-26 MED ORDER — IBUPROFEN 800 MG PO TABS: 800.0000 mg | ORAL_TABLET | Freq: Three times a day (TID) | ORAL | 0 refills | Status: DC

## 2016-07-26 NOTE — ED Triage Notes (Signed)
Pt c/o 10/10 centralized cp w/o radiation, upper back pain and URI symptoms x1 month. 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 Pt denies nausea, sob, and is not diaphoretic in triage. Pt A+OX4, speaking in complete sentences, ambulatory to triage.
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
## 2016-07-26 NOTE — ED Notes (Signed)
Bed: WA14 Expected date:  Expected time:  Means of arrival:  Comments: TR 

## 2016-07-26 NOTE — ED Notes (Signed)
Pt has generalized chest/ back pain reoccur ing over last few months.

## 2016-10-10 ENCOUNTER — Emergency Department (HOSPITAL_COMMUNITY)
Admission: EM | Admit: 2016-10-10 | Discharge: 2016-10-10 | Disposition: A | Discharge status: Home / Self Care | Payer: Managed Care, Other (non HMO) | Attending: Emergency Medicine | Admitting: Emergency Medicine

## 2016-10-10 ENCOUNTER — Encounter (HOSPITAL_COMMUNITY): Payer: Self-pay | Admitting: Emergency Medicine

## 2016-10-10 DIAGNOSIS — R51 Headache: Secondary | ICD-10-CM | POA: Diagnosis not present

## 2016-10-10 DIAGNOSIS — F17210 Nicotine dependence, cigarettes, uncomplicated: Secondary | ICD-10-CM | POA: Diagnosis not present

## 2016-10-10 DIAGNOSIS — J029 Acute pharyngitis, unspecified: Secondary | ICD-10-CM | POA: Insufficient documentation

## 2016-10-10 DIAGNOSIS — B349 Viral infection, unspecified: Secondary | ICD-10-CM | POA: Diagnosis not present

## 2016-10-10 DIAGNOSIS — H9203 Otalgia, bilateral: Secondary | ICD-10-CM | POA: Diagnosis not present

## 2016-10-10 DIAGNOSIS — R05 Cough: Secondary | ICD-10-CM | POA: Diagnosis present

## 2016-10-10 DIAGNOSIS — R197 Diarrhea, unspecified: Secondary | ICD-10-CM | POA: Diagnosis not present

## 2016-10-10 LAB — RAPID STREP SCREEN (MED CTR MEBANE ONLY): Streptococcus, Group A Screen (Direct): NEGATIVE

## 2016-10-10 MED ORDER — BENZONATATE 100 MG PO CAPS: 200.0000 mg | ORAL_CAPSULE | Freq: Two times a day (BID) | ORAL | 0 refills | Status: DC | PRN

## 2016-10-10 MED ORDER — OXYMETAZOLINE HCL 0.05 % NA SOLN: 1.0000 | Freq: Two times a day (BID) | NASAL | 0 refills | Status: DC

## 2016-10-10 MED ORDER — ACETAMINOPHEN 500 MG PO TABS
1000.0000 mg | ORAL_TABLET | Freq: Once | ORAL | Status: AC
  Administered 2016-10-10: 1000 mg via ORAL
  Filled 2016-10-10: qty 2
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
## 2016-10-10 NOTE — ED Notes (Signed)
Bed: WTR6 Expected date:  Expected time:  Means of arrival:  Comments: 

## 2016-10-10 NOTE — ED Triage Notes (Signed)
Pt reports bilateral ear pain, nausea, diarrhea and body aches x 1 day. C/o headache and throat pain. Took one dosage of Dayquil yesterday.

## 2016-10-10 NOTE — Discharge Instructions (Signed)
Take your medications as prescribed. I also recommend taking Tylenol and ibuprofen as prescribed over-the-counter, alternating between doses every 3-4 hours. Continue drinking fluids at home to remain hydrated. I recommend eating a bland diet for the next few days and taper symptoms have improved. °Follow-up with your primary care provider in the next 3-4 days if symptoms have not improved. °Return to the emergency department if symptoms worsen or new onset of headache, neck stiffness, difficulty breathing, coughing up blood, chest pain, abdominal pain, vomiting, unable to keep fluids down.  °

## 2016-10-12 LAB — CULTURE, GROUP A STREP (THRC)

## 2017-05-12 ENCOUNTER — Encounter (HOSPITAL_COMMUNITY): Payer: Self-pay | Admitting: Nurse Practitioner

## 2017-05-12 ENCOUNTER — Emergency Department (HOSPITAL_COMMUNITY)
Admission: EM | Admit: 2017-05-12 | Discharge: 2017-05-12 | Disposition: A | Discharge status: Home / Self Care | Payer: 59 | Attending: Emergency Medicine | Admitting: Emergency Medicine

## 2017-05-12 DIAGNOSIS — R030 Elevated blood-pressure reading, without diagnosis of hypertension: Secondary | ICD-10-CM | POA: Diagnosis not present

## 2017-05-12 DIAGNOSIS — L0501 Pilonidal cyst with abscess: Secondary | ICD-10-CM | POA: Diagnosis not present

## 2017-05-12 DIAGNOSIS — Z79899 Other long term (current) drug therapy: Secondary | ICD-10-CM | POA: Diagnosis not present

## 2017-05-12 DIAGNOSIS — F17210 Nicotine dependence, cigarettes, uncomplicated: Secondary | ICD-10-CM | POA: Diagnosis not present

## 2017-05-12 MED ORDER — CEPHALEXIN 500 MG PO CAPS: ORAL_CAPSULE | ORAL | 0 refills | Status: DC

## 2017-05-12 MED ORDER — LIDOCAINE-EPINEPHRINE (PF) 2 %-1:200000 IJ SOLN: 20.0000 mL | Freq: Once | INTRAMUSCULAR | Status: DC

## 2017-05-12 MED ORDER — LIDOCAINE-EPINEPHRINE 2 %-1:100000 IJ SOLN
20.0000 mL | Freq: Once | INTRAMUSCULAR | Status: AC
  Administered 2017-05-12: 1 mL
  Filled 2017-05-12: qty 20

## 2017-05-12 MED ORDER — MELOXICAM 15 MG PO TABS: 15.0000 mg | ORAL_TABLET | Freq: Every day | ORAL | 0 refills | Status: DC
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
## 2017-05-12 NOTE — ED Triage Notes (Signed)
Pt states he has a boil on his "right side of his butt" states it may have been there for a month. Denies fever or chills.

## 2017-05-12 NOTE — Discharge Instructions (Signed)
Contact a health care provider if: Your incision is bleeding. You have signs of infection at your incision or around the incision. Watch for: Drainage. Redness. Swelling. Pain. There is a bad smell coming from your incision site. Your pain medicine is not helping. You have a fever or chills. You have muscles aches. You are dizzy. You feel generally ill.

## 2017-07-20 ENCOUNTER — Emergency Department (HOSPITAL_COMMUNITY)
Admission: EM | Admit: 2017-07-20 | Discharge: 2017-07-20 | Disposition: A | Discharge status: Home / Self Care | Payer: Managed Care, Other (non HMO) | Attending: Emergency Medicine | Admitting: Emergency Medicine

## 2017-07-20 ENCOUNTER — Other Ambulatory Visit: Payer: Self-pay

## 2017-07-20 ENCOUNTER — Encounter (HOSPITAL_COMMUNITY): Payer: Self-pay

## 2017-07-20 DIAGNOSIS — R300 Dysuria: Secondary | ICD-10-CM

## 2017-07-20 DIAGNOSIS — Z711 Person with feared health complaint in whom no diagnosis is made: Secondary | ICD-10-CM

## 2017-07-20 DIAGNOSIS — R369 Urethral discharge, unspecified: Secondary | ICD-10-CM | POA: Insufficient documentation

## 2017-07-20 LAB — URINALYSIS, ROUTINE W REFLEX MICROSCOPIC
BILIRUBIN URINE: NEGATIVE
Glucose, UA: NEGATIVE mg/dL
KETONES UR: NEGATIVE mg/dL
Nitrite: NEGATIVE
Protein, ur: 30 mg/dL — AB
SPECIFIC GRAVITY, URINE: 1.019 (ref 1.005–1.030)
SQUAMOUS EPITHELIAL / LPF: NONE SEEN
pH: 7 (ref 5.0–8.0)

## 2017-07-20 LAB — RPR: RPR: NONREACTIVE

## 2017-07-20 LAB — HIV ANTIBODY (ROUTINE TESTING W REFLEX): HIV Screen 4th Generation wRfx: NONREACTIVE

## 2017-07-20 MED ORDER — DOXYCYCLINE HYCLATE 100 MG PO CAPS: 100.0000 mg | ORAL_CAPSULE | Freq: Two times a day (BID) | ORAL | 0 refills | Status: DC

## 2017-07-20 MED ORDER — DOXYCYCLINE HYCLATE 100 MG PO TABS
100.0000 mg | ORAL_TABLET | Freq: Once | ORAL | Status: AC
  Administered 2017-07-20: 100 mg via ORAL
  Filled 2017-07-20: qty 1

## 2017-07-20 MED ORDER — CEFTRIAXONE SODIUM 250 MG IJ SOLR
250.0000 mg | Freq: Once | INTRAMUSCULAR | Status: AC
  Administered 2017-07-20: 250 mg via INTRAMUSCULAR
  Filled 2017-07-20: qty 250

## 2017-07-20 MED ORDER — LIDOCAINE HCL 1 % IJ SOLN
INTRAMUSCULAR | Status: AC
  Administered 2017-07-20: 0.9 mL
  Filled 2017-07-20: qty 20

## 2017-07-20 NOTE — ED Triage Notes (Signed)
Patient reports a yellow drainage from penis since yesterday.

## 2017-07-20 NOTE — Discharge Instructions (Signed)
1.  No sexual activity until you have completed your treatment and followed up on the results of your testing.  This includes HIV and hepatitis testing. 2.  Your partner should be tested as well. 3.  Further testing and [facility].  Information listed in your follow-up discharge instruction.
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
## 2017-07-21 LAB — GC/CHLAMYDIA PROBE AMP (~~LOC~~) NOT AT ARMC
Chlamydia: NEGATIVE
Neisseria Gonorrhea: POSITIVE — AB

## 2018-02-13 IMAGING — CR DG CHEST 2V
2 series · 2 of 2 positions shown · non-contrast
Comparison: 09/09/2015

CLINICAL DATA: Central chest and upper back pain with upper
respiratory infection symptoms for 1 month. Right shoulder pain.

EXAM:
CHEST  2 VIEW

[w chest pa]
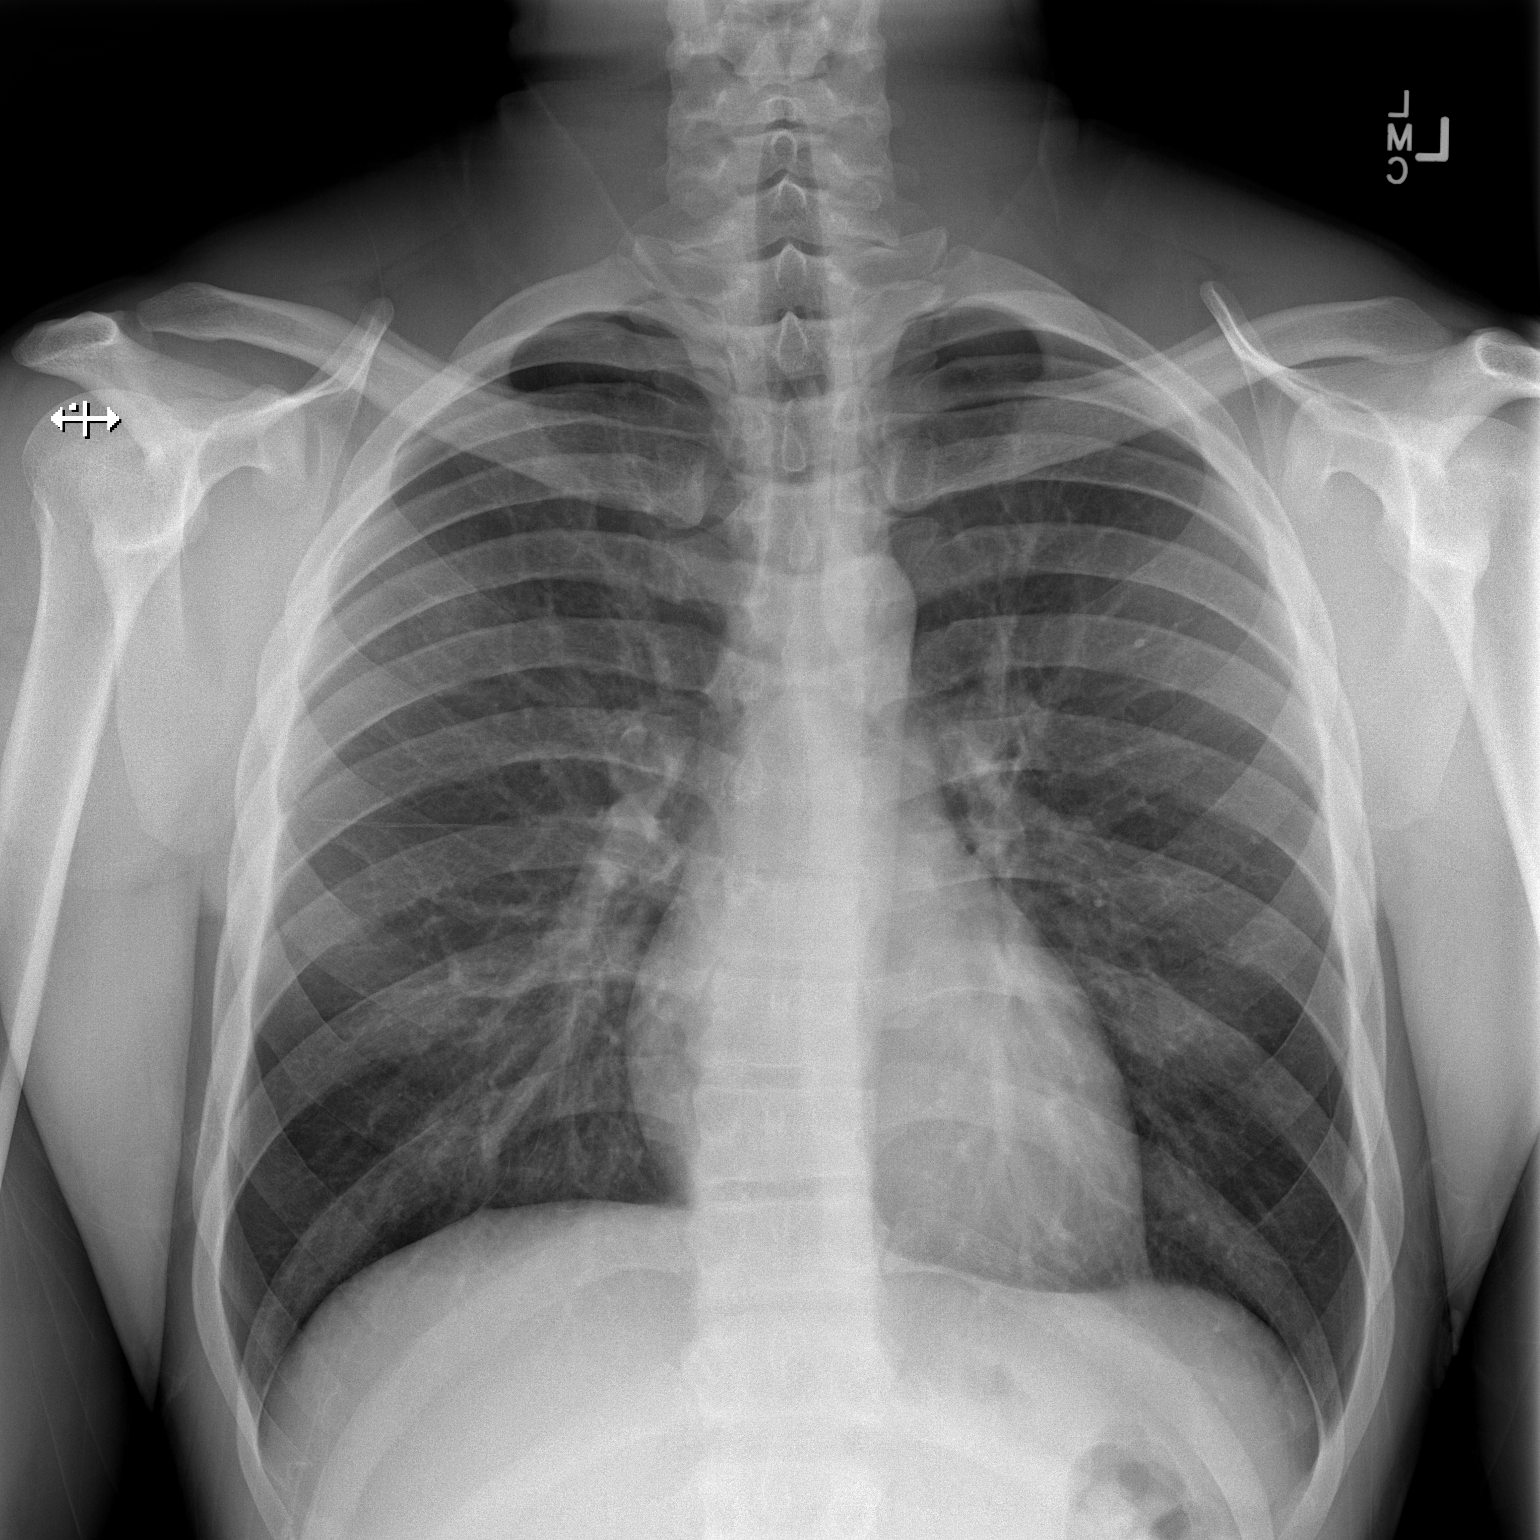

[w chest lat]
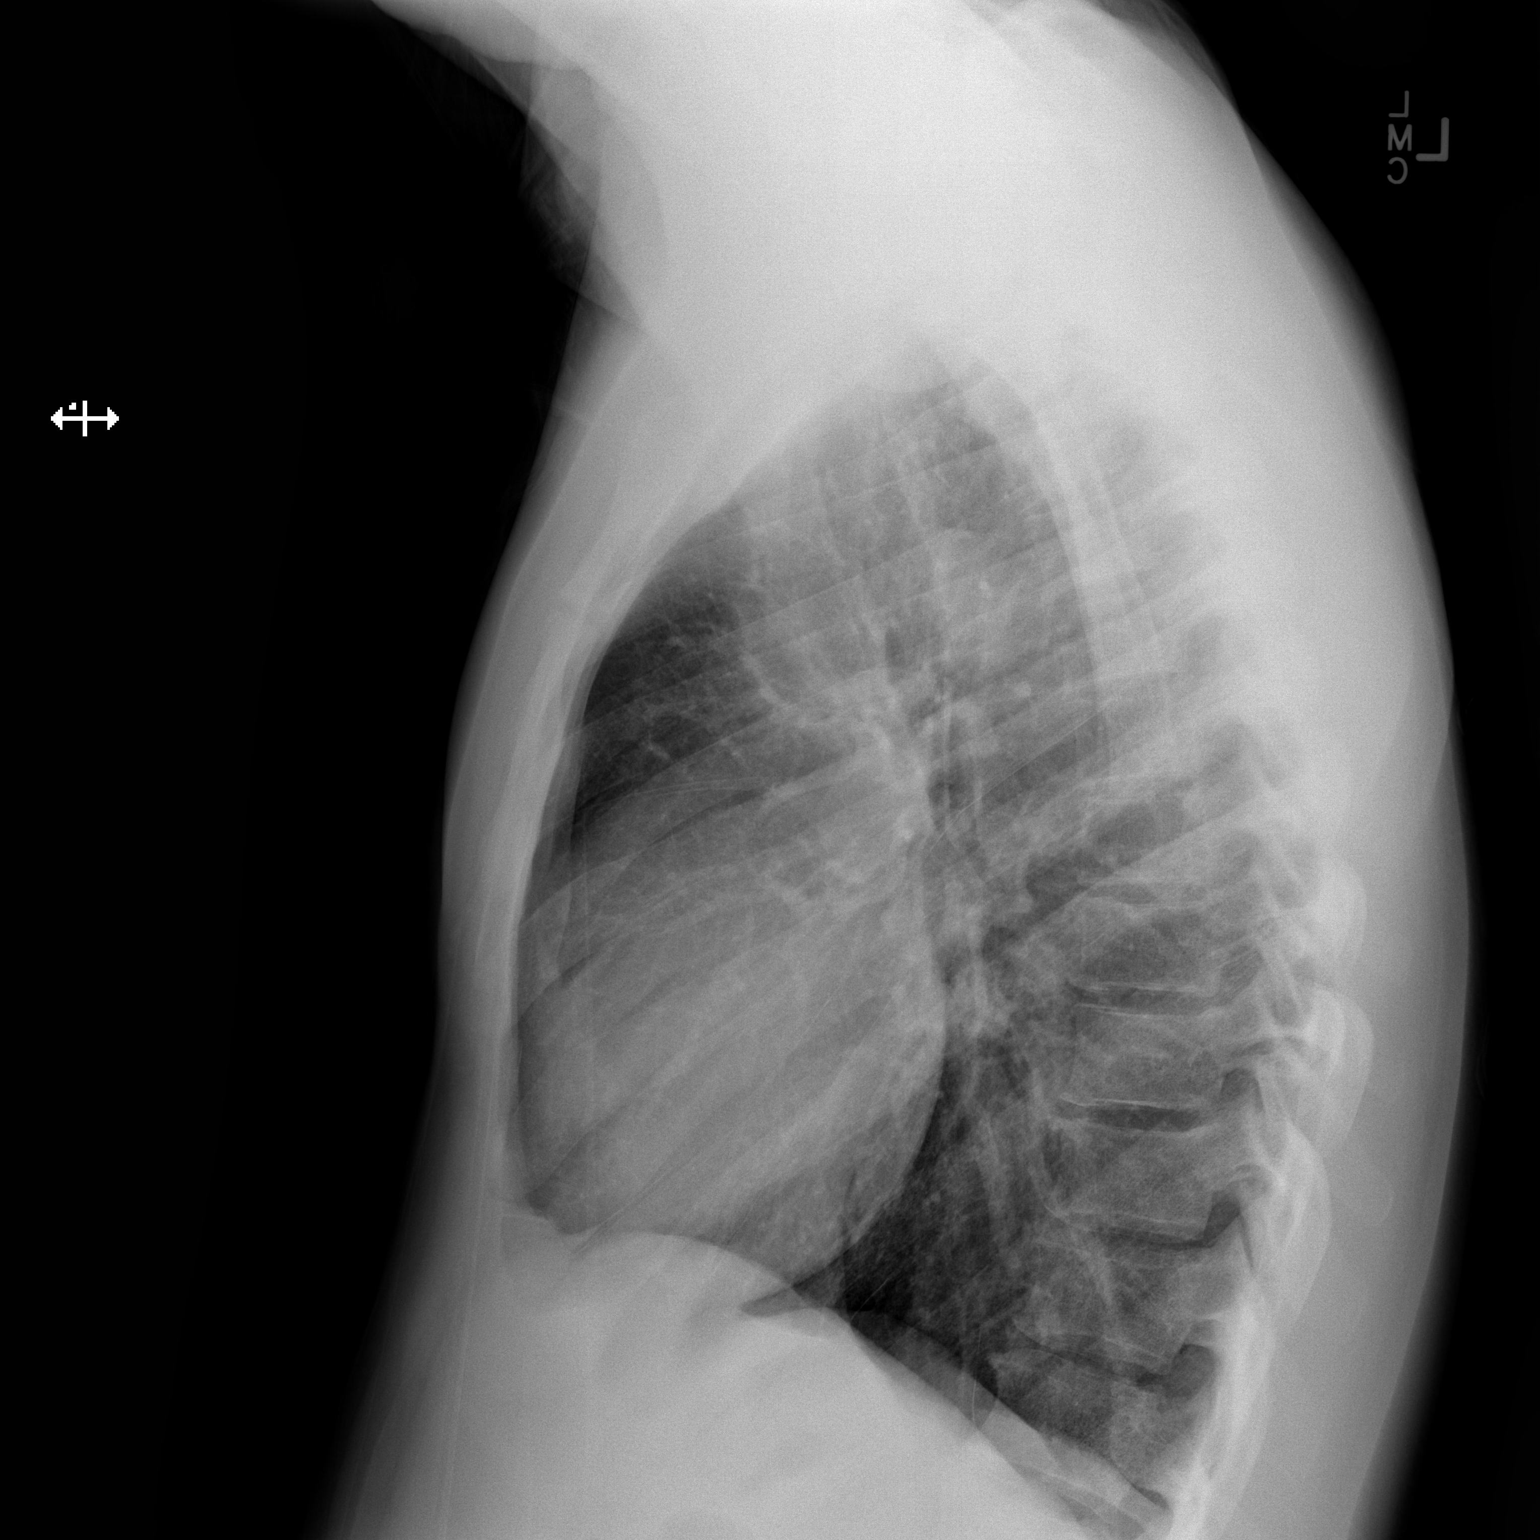

[2 of 2 positions shown; findings below may reference images not displayed]

FINDINGS: The heart size and mediastinal contours are within normal limits.
Both lungs are clear. The visualized skeletal structures are
unremarkable.
IMPRESSION: No active cardiopulmonary disease.

## 2018-05-05 ENCOUNTER — Encounter (HOSPITAL_COMMUNITY): Payer: Self-pay | Admitting: Emergency Medicine

## 2018-05-05 ENCOUNTER — Emergency Department (HOSPITAL_COMMUNITY)
Admission: EM | Admit: 2018-05-05 | Discharge: 2018-05-05 | Disposition: A | Discharge status: Home / Self Care | Payer: Self-pay | Attending: Emergency Medicine | Admitting: Emergency Medicine

## 2018-05-05 ENCOUNTER — Other Ambulatory Visit: Payer: Self-pay

## 2018-05-05 DIAGNOSIS — Z79899 Other long term (current) drug therapy: Secondary | ICD-10-CM | POA: Insufficient documentation

## 2018-05-05 DIAGNOSIS — J020 Streptococcal pharyngitis: Secondary | ICD-10-CM | POA: Insufficient documentation

## 2018-05-05 DIAGNOSIS — F17210 Nicotine dependence, cigarettes, uncomplicated: Secondary | ICD-10-CM | POA: Insufficient documentation

## 2018-05-05 LAB — GROUP A STREP BY PCR: Group A Strep by PCR: DETECTED — AB

## 2018-05-05 MED ORDER — ACETAMINOPHEN 325 MG PO TABS
650.0000 mg | ORAL_TABLET | Freq: Once | ORAL | Status: AC
  Administered 2018-05-05: 650 mg via ORAL
  Filled 2018-05-05: qty 2

## 2018-05-05 MED ORDER — KETOROLAC TROMETHAMINE 60 MG/2ML IM SOLN
60.0000 mg | Freq: Once | INTRAMUSCULAR | Status: AC
  Administered 2018-05-05: 60 mg via INTRAMUSCULAR
  Filled 2018-05-05: qty 2

## 2018-05-05 MED ORDER — AMOXICILLIN 500 MG PO CAPS: 500.0000 mg | ORAL_CAPSULE | Freq: Two times a day (BID) | ORAL | 0 refills | Status: DC

## 2018-05-05 MED ORDER — BENZONATATE 100 MG PO CAPS: 100.0000 mg | ORAL_CAPSULE | Freq: Three times a day (TID) | ORAL | 0 refills | Status: DC

## 2018-05-05 NOTE — ED Triage Notes (Signed)
Pt c/o sore throat and ear pain and cough for couple days. Reports took ibuprofen, gargle with salt water, and theraflu.

## 2018-05-05 NOTE — Discharge Instructions (Signed)
Take amoxicillin as prescribed until all gone.  Take Tessalon as needed for cough.  Continue ibuprofen and Tylenol for your fever.  Drink plenty of fluids, rest.  He can try salt water gargles and Chloraseptic throat spray for sore throat relief.  Return or follow-up as needed.
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
## 2018-05-07 ENCOUNTER — Emergency Department (HOSPITAL_COMMUNITY)
Admission: EM | Admit: 2018-05-07 | Discharge: 2018-05-07 | Disposition: A | Discharge status: Home / Self Care | Payer: Self-pay | Attending: Emergency Medicine | Admitting: Emergency Medicine

## 2018-05-07 ENCOUNTER — Encounter (HOSPITAL_COMMUNITY): Payer: Self-pay | Admitting: Emergency Medicine

## 2018-05-07 DIAGNOSIS — Z79899 Other long term (current) drug therapy: Secondary | ICD-10-CM | POA: Insufficient documentation

## 2018-05-07 DIAGNOSIS — J020 Streptococcal pharyngitis: Secondary | ICD-10-CM | POA: Insufficient documentation

## 2018-05-07 DIAGNOSIS — F17210 Nicotine dependence, cigarettes, uncomplicated: Secondary | ICD-10-CM | POA: Insufficient documentation

## 2018-05-07 MED ORDER — CLINDAMYCIN HCL 150 MG PO CAPS: 150.0000 mg | ORAL_CAPSULE | Freq: Three times a day (TID) | ORAL | 0 refills | Status: AC

## 2018-05-07 MED ORDER — DEXAMETHASONE 4 MG PO TABS
10.0000 mg | ORAL_TABLET | Freq: Once | ORAL | Status: AC
  Administered 2018-05-07: 10 mg via ORAL
  Filled 2018-05-07: qty 2

## 2018-05-07 MED ORDER — IBUPROFEN 800 MG PO TABS
800.0000 mg | ORAL_TABLET | Freq: Once | ORAL | Status: AC
  Administered 2018-05-07: 800 mg via ORAL
  Filled 2018-05-07: qty 1

## 2018-05-07 NOTE — ED Triage Notes (Signed)
Pt reports was seen here Sunday and medications not helping with throat.

## 2018-05-07 NOTE — Discharge Instructions (Addendum)
Please return to the emergency department if you have any worsening symptoms.  Follow-up with Dr. Jenne PaneBates above for ENT.  Please use Motrin 800 mg every 8 hours for the next several days.
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
## 2018-08-16 ENCOUNTER — Inpatient Hospital Stay (HOSPITAL_COMMUNITY)
Admission: EM | Admit: 2018-08-16 | Discharge: 2018-08-20 | DRG: 282 | Disposition: A | Discharge status: Home / Self Care | Payer: Medicaid Other | Attending: Internal Medicine | Admitting: Internal Medicine

## 2018-08-16 ENCOUNTER — Other Ambulatory Visit: Payer: Self-pay

## 2018-08-16 ENCOUNTER — Emergency Department (HOSPITAL_COMMUNITY): Payer: Medicaid Other

## 2018-08-16 ENCOUNTER — Encounter (HOSPITAL_COMMUNITY): Payer: Self-pay

## 2018-08-16 DIAGNOSIS — Z789 Other specified health status: Secondary | ICD-10-CM

## 2018-08-16 DIAGNOSIS — Z20828 Contact with and (suspected) exposure to other viral communicable diseases: Secondary | ICD-10-CM | POA: Diagnosis present

## 2018-08-16 DIAGNOSIS — R197 Diarrhea, unspecified: Secondary | ICD-10-CM | POA: Diagnosis present

## 2018-08-16 DIAGNOSIS — I959 Hypotension, unspecified: Secondary | ICD-10-CM | POA: Diagnosis present

## 2018-08-16 DIAGNOSIS — Z8249 Family history of ischemic heart disease and other diseases of the circulatory system: Secondary | ICD-10-CM

## 2018-08-16 DIAGNOSIS — F17210 Nicotine dependence, cigarettes, uncomplicated: Secondary | ICD-10-CM | POA: Diagnosis present

## 2018-08-16 DIAGNOSIS — K529 Noninfective gastroenteritis and colitis, unspecified: Secondary | ICD-10-CM

## 2018-08-16 DIAGNOSIS — E876 Hypokalemia: Secondary | ICD-10-CM | POA: Diagnosis present

## 2018-08-16 DIAGNOSIS — I214 Non-ST elevation (NSTEMI) myocardial infarction: Secondary | ICD-10-CM | POA: Diagnosis present

## 2018-08-16 DIAGNOSIS — F1290 Cannabis use, unspecified, uncomplicated: Secondary | ICD-10-CM | POA: Diagnosis present

## 2018-08-16 DIAGNOSIS — B349 Viral infection, unspecified: Secondary | ICD-10-CM

## 2018-08-16 DIAGNOSIS — B3323 Viral pericarditis: Secondary | ICD-10-CM | POA: Diagnosis present

## 2018-08-16 DIAGNOSIS — R778 Other specified abnormalities of plasma proteins: Secondary | ICD-10-CM

## 2018-08-16 DIAGNOSIS — R7989 Other specified abnormal findings of blood chemistry: Secondary | ICD-10-CM

## 2018-08-16 DIAGNOSIS — G4730 Sleep apnea, unspecified: Secondary | ICD-10-CM | POA: Diagnosis present

## 2018-08-16 DIAGNOSIS — F1910 Other psychoactive substance abuse, uncomplicated: Secondary | ICD-10-CM

## 2018-08-16 DIAGNOSIS — R079 Chest pain, unspecified: Secondary | ICD-10-CM

## 2018-08-16 DIAGNOSIS — R072 Precordial pain: Secondary | ICD-10-CM

## 2018-08-16 LAB — URINALYSIS, ROUTINE W REFLEX MICROSCOPIC
Bacteria, UA: NONE SEEN
Bilirubin Urine: NEGATIVE
Glucose, UA: NEGATIVE mg/dL
Ketones, ur: 5 mg/dL — AB
Leukocytes,Ua: NEGATIVE
Nitrite: NEGATIVE
Protein, ur: NEGATIVE mg/dL
Specific Gravity, Urine: 1.006 (ref 1.005–1.030)
pH: 8 (ref 5.0–8.0)

## 2018-08-16 LAB — COMPREHENSIVE METABOLIC PANEL
ALT: 23 U/L (ref 0–44)
AST: 32 U/L (ref 15–41)
Albumin: 3.8 g/dL (ref 3.5–5.0)
Alkaline Phosphatase: 52 U/L (ref 38–126)
Anion gap: 11 (ref 5–15)
BUN: 7 mg/dL (ref 6–20)
CO2: 20 mmol/L — ABNORMAL LOW (ref 22–32)
Calcium: 8.9 mg/dL (ref 8.9–10.3)
Chloride: 106 mmol/L (ref 98–111)
Creatinine, Ser: 0.9 mg/dL (ref 0.61–1.24)
GFR calc Af Amer: >60 mL/min (ref >60)
GFR calc non Af Amer: >60 mL/min (ref >60)
Glucose, Bld: 126 mg/dL — ABNORMAL HIGH (ref 70–99)
Potassium: 3.4 mmol/L — ABNORMAL LOW (ref 3.5–5.1)
Sodium: 137 mmol/L (ref 135–145)
Total Bilirubin: 0.8 mg/dL (ref 0.3–1.2)
Total Protein: 6.2 g/dL — ABNORMAL LOW (ref 6.5–8.1)

## 2018-08-16 LAB — CBC
HCT: 47.2 % (ref 39.0–52.0)
Hemoglobin: 15.7 g/dL (ref 13.0–17.0)
MCH: 30.8 pg (ref 26.0–34.0)
MCHC: 33.3 g/dL (ref 30.0–36.0)
MCV: 92.7 fL (ref 80.0–100.0)
Platelets: 203 10*3/uL (ref 150–400)
RBC: 5.09 MIL/uL (ref 4.22–5.81)
RDW: 12.8 % (ref 11.5–15.5)
WBC: 10.3 10*3/uL (ref 4.0–10.5)
nRBC: 0 % (ref 0.0–0.2)

## 2018-08-16 LAB — INFLUENZA PANEL BY PCR (TYPE A & B)
Influenza A By PCR: NEGATIVE
Influenza B By PCR: NEGATIVE

## 2018-08-16 LAB — TROPONIN I: Troponin I: 0.44 ng/mL (ref <0.03)

## 2018-08-16 LAB — BRAIN NATRIURETIC PEPTIDE: B Natriuretic Peptide: 25.3 pg/mL (ref 0.0–100.0)

## 2018-08-16 MED ORDER — DICYCLOMINE HCL 10 MG/5ML PO SOLN
10.0000 mg | Freq: Once | ORAL | Status: AC
  Administered 2018-08-16: 10 mg via ORAL
  Filled 2018-08-16: qty 5

## 2018-08-16 MED ORDER — ACETAMINOPHEN 325 MG PO TABS: 650.0000 mg | ORAL_TABLET | Freq: Four times a day (QID) | ORAL | Status: DC | PRN

## 2018-08-16 MED ORDER — POTASSIUM CHLORIDE 20 MEQ PO PACK
40.0000 meq | PACK | Freq: Once | ORAL | Status: AC
  Administered 2018-08-17: 40 meq via ORAL
  Filled 2018-08-16: qty 2

## 2018-08-16 MED ORDER — LIDOCAINE VISCOUS HCL 2 % MT SOLN
15.0000 mL | Freq: Once | OROMUCOSAL | Status: AC
  Administered 2018-08-16: 15 mL via ORAL
  Filled 2018-08-16: qty 15

## 2018-08-16 MED ORDER — ASPIRIN 81 MG PO CHEW
324.0000 mg | CHEWABLE_TABLET | Freq: Once | ORAL | Status: AC
  Administered 2018-08-16: 324 mg via ORAL
  Filled 2018-08-16: qty 4

## 2018-08-16 MED ORDER — ACETAMINOPHEN 650 MG RE SUPP: 650.0000 mg | Freq: Four times a day (QID) | RECTAL | Status: DC | PRN

## 2018-08-16 MED ORDER — ONDANSETRON HCL 4 MG/2ML IJ SOLN: 4.0000 mg | Freq: Four times a day (QID) | INTRAMUSCULAR | Status: DC | PRN

## 2018-08-16 MED ORDER — OXYCODONE HCL 5 MG PO TABS: 5.0000 mg | ORAL_TABLET | ORAL | Status: DC | PRN

## 2018-08-16 MED ORDER — LACTATED RINGERS IV SOLN
INTRAVENOUS | Status: DC
  Administered 2018-08-17 – 2018-08-18 (×3): via INTRAVENOUS

## 2018-08-16 MED ORDER — ALUM & MAG HYDROXIDE-SIMETH 200-200-20 MG/5ML PO SUSP
30.0000 mL | Freq: Once | ORAL | Status: AC
  Administered 2018-08-16: 30 mL via ORAL
  Filled 2018-08-16: qty 30

## 2018-08-16 MED ORDER — SODIUM CHLORIDE 0.9% FLUSH
3.0000 mL | Freq: Once | INTRAVENOUS | Status: AC
  Administered 2018-08-16: 3 mL via INTRAVENOUS

## 2018-08-16 MED ORDER — KETOROLAC TROMETHAMINE 30 MG/ML IJ SOLN
30.0000 mg | Freq: Once | INTRAMUSCULAR | Status: AC
  Administered 2018-08-16: 30 mg via INTRAVENOUS
  Filled 2018-08-16: qty 1

## 2018-08-16 MED ORDER — ONDANSETRON HCL 4 MG PO TABS: 4.0000 mg | ORAL_TABLET | Freq: Four times a day (QID) | ORAL | Status: DC | PRN

## 2018-08-16 MED ORDER — MORPHINE SULFATE (PF) 2 MG/ML IV SOLN: 1.0000 mg | INTRAVENOUS | Status: DC | PRN

## 2018-08-16 MED ORDER — COLCHICINE 0.6 MG PO TABS
0.6000 mg | ORAL_TABLET | Freq: Two times a day (BID) | ORAL | Status: DC
  Administered 2018-08-17 (×2): 0.6 mg via ORAL
  Filled 2018-08-16 (×2): qty 1

## 2018-08-16 MED ORDER — ASPIRIN EC 81 MG PO TBEC
81.0000 mg | DELAYED_RELEASE_TABLET | Freq: Every day | ORAL | Status: DC
  Administered 2018-08-17 – 2018-08-20 (×4): 81 mg via ORAL
  Filled 2018-08-16 (×4): qty 1

## 2018-08-16 MED ORDER — ENOXAPARIN SODIUM 40 MG/0.4ML ~~LOC~~ SOLN: 40.0000 mg | Freq: Every day | SUBCUTANEOUS | Status: DC

## 2018-08-16 MED ORDER — LOPERAMIDE HCL 2 MG PO CAPS: 2.0000 mg | ORAL_CAPSULE | ORAL | Status: DC | PRN
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
## 2018-08-16 NOTE — ED Notes (Addendum)
CRITICAL VALUE STICKER  CRITICAL VALUE: Trop. 0.44  RECEIVER (on-site recipient of call): C. Tex Conroy, RN   DATE & TIME NOTIFIED:  08/16/2018/ 1716  MESSENGER (representative from lab): Kyung Rudd  MD NOTIFIED: Dr. Particia Nearing  TIME OF NOTIFICATION:1720  RESPONSE: awaiting orders
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
## 2018-08-16 NOTE — ED Triage Notes (Signed)
Patient c/o chest pain "all over", SOB, chills all day.

## 2018-08-16 NOTE — ED Notes (Signed)
Pt reports mid-upper cp which started earlier today.  He endorses sob and chills with the cp.  He describes the pain in his chest as burning sensation.  Positive hx of GERD.  He is a smoker and drinks occasionally.  He was hyperventilating upon entering his room.  He appears anxious and is crying.

## 2018-08-17 ENCOUNTER — Observation Stay (HOSPITAL_BASED_OUTPATIENT_CLINIC_OR_DEPARTMENT_OTHER): Payer: Medicaid Other

## 2018-08-17 ENCOUNTER — Encounter (HOSPITAL_COMMUNITY): Payer: Self-pay | Admitting: Internal Medicine

## 2018-08-17 DIAGNOSIS — E876 Hypokalemia: Secondary | ICD-10-CM

## 2018-08-17 DIAGNOSIS — F1290 Cannabis use, unspecified, uncomplicated: Secondary | ICD-10-CM | POA: Diagnosis present

## 2018-08-17 DIAGNOSIS — Z20828 Contact with and (suspected) exposure to other viral communicable diseases: Secondary | ICD-10-CM | POA: Diagnosis present

## 2018-08-17 DIAGNOSIS — R6889 Other general symptoms and signs: Secondary | ICD-10-CM

## 2018-08-17 DIAGNOSIS — I214 Non-ST elevation (NSTEMI) myocardial infarction: Secondary | ICD-10-CM | POA: Diagnosis present

## 2018-08-17 DIAGNOSIS — R778 Other specified abnormalities of plasma proteins: Secondary | ICD-10-CM

## 2018-08-17 DIAGNOSIS — I959 Hypotension, unspecified: Secondary | ICD-10-CM | POA: Diagnosis present

## 2018-08-17 DIAGNOSIS — B349 Viral infection, unspecified: Secondary | ICD-10-CM | POA: Diagnosis present

## 2018-08-17 DIAGNOSIS — R7989 Other specified abnormal findings of blood chemistry: Secondary | ICD-10-CM

## 2018-08-17 DIAGNOSIS — Z789 Other specified health status: Secondary | ICD-10-CM | POA: Diagnosis not present

## 2018-08-17 DIAGNOSIS — B3323 Viral pericarditis: Secondary | ICD-10-CM | POA: Diagnosis present

## 2018-08-17 DIAGNOSIS — G4730 Sleep apnea, unspecified: Secondary | ICD-10-CM | POA: Diagnosis present

## 2018-08-17 DIAGNOSIS — R079 Chest pain, unspecified: Secondary | ICD-10-CM

## 2018-08-17 DIAGNOSIS — Z1159 Encounter for screening for other viral diseases: Secondary | ICD-10-CM

## 2018-08-17 DIAGNOSIS — Z8249 Family history of ischemic heart disease and other diseases of the circulatory system: Secondary | ICD-10-CM | POA: Diagnosis not present

## 2018-08-17 DIAGNOSIS — F17210 Nicotine dependence, cigarettes, uncomplicated: Secondary | ICD-10-CM | POA: Diagnosis present

## 2018-08-17 DIAGNOSIS — R197 Diarrhea, unspecified: Secondary | ICD-10-CM | POA: Diagnosis present

## 2018-08-17 LAB — RAPID URINE DRUG SCREEN, HOSP PERFORMED
Amphetamines: NOT DETECTED
Barbiturates: NOT DETECTED
Benzodiazepines: NOT DETECTED
Cocaine: NOT DETECTED
Opiates: NOT DETECTED
Tetrahydrocannabinol: POSITIVE — AB

## 2018-08-17 LAB — CBC
HCT: 43.1 % (ref 39.0–52.0)
Hemoglobin: 14.7 g/dL (ref 13.0–17.0)
MCH: 32 pg (ref 26.0–34.0)
MCHC: 34.1 g/dL (ref 30.0–36.0)
MCV: 93.7 fL (ref 80.0–100.0)
Platelets: 194 10*3/uL (ref 150–400)
RBC: 4.6 MIL/uL (ref 4.22–5.81)
RDW: 12.9 % (ref 11.5–15.5)
WBC: 9.1 10*3/uL (ref 4.0–10.5)
nRBC: 0 % (ref 0.0–0.2)

## 2018-08-17 LAB — MAGNESIUM: Magnesium: 2 mg/dL (ref 1.7–2.4)

## 2018-08-17 LAB — BASIC METABOLIC PANEL
Anion gap: 8 (ref 5–15)
BUN: 8 mg/dL (ref 6–20)
CO2: 24 mmol/L (ref 22–32)
Calcium: 8.5 mg/dL — ABNORMAL LOW (ref 8.9–10.3)
Chloride: 107 mmol/L (ref 98–111)
Creatinine, Ser: 1.03 mg/dL (ref 0.61–1.24)
GFR calc Af Amer: >60 mL/min (ref >60)
GFR calc non Af Amer: >60 mL/min (ref >60)
Glucose, Bld: 94 mg/dL (ref 70–99)
Potassium: 3.3 mmol/L — ABNORMAL LOW (ref 3.5–5.1)
Sodium: 139 mmol/L (ref 135–145)

## 2018-08-17 LAB — CREATININE, SERUM
Creatinine, Ser: 0.92 mg/dL (ref 0.61–1.24)
GFR calc Af Amer: >60 mL/min (ref >60)
GFR calc non Af Amer: >60 mL/min (ref >60)

## 2018-08-17 LAB — TROPONIN I
Troponin I: 11.01 ng/mL (ref <0.03)
Troponin I: 9.57 ng/mL (ref <0.03)
Troponin I: 5.3 ng/mL (ref <0.03)
Troponin I: 3.97 ng/mL (ref <0.03)

## 2018-08-17 LAB — HEPARIN LEVEL (UNFRACTIONATED)
Heparin Unfractionated: 0.16 IU/mL — ABNORMAL LOW (ref 0.30–0.70)
Heparin Unfractionated: 0.65 IU/mL (ref 0.30–0.70)
Heparin Unfractionated: 0.61 IU/mL (ref 0.30–0.70)

## 2018-08-17 LAB — PROTIME-INR
INR: 1.1 (ref 0.8–1.2)
Prothrombin Time: 14.2 seconds (ref 11.4–15.2)

## 2018-08-17 LAB — CK TOTAL AND CKMB (NOT AT ARMC)
CK, MB: 63.5 ng/mL — ABNORMAL HIGH (ref 0.5–5.0)
Relative Index: 8.3 — ABNORMAL HIGH (ref 0.0–2.5)
Total CK: 765 U/L — ABNORMAL HIGH (ref 49–397)

## 2018-08-17 LAB — LACTATE DEHYDROGENASE: LDH: 171 U/L (ref 98–192)

## 2018-08-17 LAB — FERRITIN: Ferritin: 37 ng/mL (ref 24–336)

## 2018-08-17 LAB — TSH: TSH: 2.104 u[IU]/mL (ref 0.350–4.500)

## 2018-08-17 LAB — SEDIMENTATION RATE: Sed Rate: 1 mm/hr (ref 0–16)

## 2018-08-17 LAB — ECHOCARDIOGRAM COMPLETE
Height: 74 in
Weight: 2879.98 oz

## 2018-08-17 LAB — HIV ANTIBODY (ROUTINE TESTING W REFLEX): HIV Screen 4th Generation wRfx: NONREACTIVE

## 2018-08-17 LAB — APTT: aPTT: 25 seconds (ref 24–36)

## 2018-08-17 LAB — C-REACTIVE PROTEIN: CRP: <0.8 mg/dL (ref <1.0)

## 2018-08-17 MED ORDER — HEPARIN (PORCINE) 25000 UT/250ML-% IV SOLN
1400.0000 [IU]/h | INTRAVENOUS | Status: DC
  Administered 2018-08-17: 1400 [IU]/h via INTRAVENOUS
  Administered 2018-08-17: 1000 [IU]/h via INTRAVENOUS
  Administered 2018-08-18: 1400 [IU]/h via INTRAVENOUS
  Filled 2018-08-17 (×3): qty 250

## 2018-08-17 MED ORDER — HEPARIN BOLUS VIA INFUSION
2400.0000 [IU] | Freq: Once | INTRAVENOUS | Status: AC
  Administered 2018-08-17: 2400 [IU] via INTRAVENOUS
  Filled 2018-08-17: qty 2400

## 2018-08-17 MED ORDER — ATORVASTATIN CALCIUM 40 MG PO TABS
80.0000 mg | ORAL_TABLET | Freq: Every day | ORAL | Status: DC
  Administered 2018-08-17: 80 mg via ORAL
  Filled 2018-08-17: qty 2

## 2018-08-17 MED ORDER — NITROGLYCERIN 0.4 MG SL SUBL: 0.4000 mg | SUBLINGUAL_TABLET | SUBLINGUAL | Status: DC | PRN

## 2018-08-17 MED ORDER — POTASSIUM CHLORIDE CRYS ER 20 MEQ PO TBCR
40.0000 meq | EXTENDED_RELEASE_TABLET | Freq: Once | ORAL | Status: AC
  Administered 2018-08-17: 40 meq via ORAL
  Filled 2018-08-17: qty 2

## 2018-08-17 MED ORDER — HEPARIN BOLUS VIA INFUSION
4000.0000 [IU] | Freq: Once | INTRAVENOUS | Status: AC
  Administered 2018-08-17: 4000 [IU] via INTRAVENOUS
  Filled 2018-08-17: qty 4000
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
## 2018-08-17 NOTE — Plan of Care (Signed)
Plan of care discussed.   
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
## 2018-08-17 NOTE — Progress Notes (Signed)
Echocardiogram 2D Echocardiogram has been performed.  Phillip [name] 08/17/2018, 10:32 AM
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
## 2018-08-17 NOTE — Progress Notes (Signed)
[facility] PROGRESS NOTE  Shyquan Shumard JJK:[id number] DOB: 04-12-1981 DOA: 08/16/2018  PCP: Patient, [name]  Brief History/Interval Summary: 38 y.o. male with history h/o smoking, marijuana abuse who has not seen a PCP in recent years presented with complaints of chest pain.  's was described as a pressure-like sensation.  It was 10 out of 10 in intensity.  EKG showed nonspecific changes.  Patient had initially mild elevation in troponin.  Discussed with cardiology.  He was hospitalized.  Overnight his troponins became significantly elevated.  Placed on heparin.  There is a concern for viral myocarditis.    Reason for Visit: Chest pain. NSTEMI  Consultants: Cardiology  Procedures: Transthoracic echocardiogram is pending  Antibiotics: None  Subjective/Interval History: Patient states that currently pain in the chest is 3 out of 10 in intensity.  Denies any pleuritic component.  No change in pain with change in body position.  Denies any shortness of breath.  No cough.  No history of fever or chills.  No previous history of heart disease.  ROS: Denies any nausea or vomiting  Objective:  Vital Signs  Vitals:   08/16/18 2230 08/17/18 0000 08/17/18 0105 08/17/18 0552  BP: 114/61 (!) 147/90 136/83 139/81  Pulse: (!) 59 79 (!) 55 (!) 53  Resp: 19 20 20 20   Temp:   98.4 F (36.9 C) 98.5 F (36.9 C)  TempSrc:   Oral Oral  SpO2: 95% 96% 100% 99%  Weight:   81.6 kg   Height:   6\' 2"  (1.88 m)     Intake/Output Summary (Last 24 hours) at 08/17/2018 1000 Last data filed at 08/17/2018 0900 Gross per 24 hour  Intake 1428.36 ml  Output 500 ml  Net 928.36 ml   Filed Weights   08/16/18 1532 08/17/18 0105  Weight: 81.6 kg 81.6 kg    General appearance: alert, cooperative, appears stated age and no distress Head: Normocephalic, without obvious abnormality, atraumatic Resp: clear to auscultation bilaterally Cardio: regular rate and rhythm, S1, S2 normal, no murmur, click,  rub or gallop GI: soft, non-tender; bowel sounds normal; no masses,  no organomegaly Extremities: extremities normal, atraumatic, no cyanosis or edema Pulses: 2+ and symmetric Neurologic: Alert and oriented x3.  No obvious focal neurological deficits.  Lab Results:  Data Reviewed: I have personally reviewed following labs and imaging studies  CBC: Recent Labs  Lab 08/16/18 1553 08/17/18 0021  WBC 10.3 9.1  HGB 15.7 14.7  HCT 47.2 43.1  MCV 92.7 93.7  PLT 203 194    Basic Metabolic Panel: Recent Labs  Lab 08/16/18 1604 08/17/18 0021 08/17/18 0302  NA 137  --  139  K 3.4*  --  3.3*  CL 106  --  107  CO2 20*  --  24  GLUCOSE 126*  --  94  BUN 7  --  8  CREATININE 0.90 0.92 1.03  CALCIUM 8.9  --  8.5*    GFR: Estimated Creatinine Clearance: 115.5 mL/min (by C-G formula based on SCr of 1.03 mg/dL).  Liver Function Tests: Recent Labs  Lab 08/16/18 1604  AST 32  ALT 23  ALKPHOS 52  BILITOT 0.8  PROT 6.2*  ALBUMIN 3.8    Coagulation Profile: Recent Labs  Lab 08/17/18 0302  INR 1.1    Cardiac Enzymes: Recent Labs  Lab 08/16/18 1553 08/17/18 0021 08/17/18 0302  CKTOTAL  --  765*  --   CKMB  --  63.5*  --   TROPONINI 0.44*  11.01* 9.57*     Thyroid Function Tests: Recent Labs    08/17/18 0021  TSH 2.104    Anemia Panel: Recent Labs    08/17/18 0021  FERRITIN 37     Radiology Studies: Dg Chest Port 1 View  Result Date: 08/16/2018 CLINICAL DATA:  Left sided chest pain, shortness of breath and chills onset today. EXAM: PORTABLE CHEST 1 VIEW COMPARISON:  07/26/2016 FINDINGS: The heart size and mediastinal contours are within normal limits. Both lungs are clear. The visualized skeletal structures are unremarkable. IMPRESSION: No active disease. Electronically Signed   By: Gaylyn Rong M.D.   On: 08/16/2018 16:37     Medications:  Scheduled:  aspirin EC  81 mg Oral Daily   colchicine  0.6 mg Oral BID   Continuous:  heparin  1,000 Units/hr (08/17/18 0600)   lactated ringers 75 mL/hr at 08/17/18 0600   EUM:[id number] **OR** acetaminophen, loperamide, morphine injection, ondansetron **OR** ondansetron (ZOFRAN) IV, oxyCODONE    Assessment/Plan:  PPE Date: 08/17/2018   Patient Isolation: Droplet+Contact   HCP PPE:  wearing a facemask wearing gown & gloves wearing eye protection  Patient PPE: None  Chest pain/possible NSTEMI versus viral myocarditis Cardiology has been consulted.  Patient had significant elevation in troponin overnight.  Pain is 3 out of 10 in intensity currently.  Patient without any personal risk factors for CAD except for family history of unspecified heart disease.  EKG did not show any obvious ischemic ST segment changes.  Nonspecific changes were noted.  Echocardiogram has been appropriately ordered.  Wait on cardiology input.  Continue aspirin.  Case was discussed with cardiology at admission and they recommended colchicine which is being continued.  Suspected COVID-19 Patient denies any exposure to anybody who has been diagnosed with COVID-19.  Patient denies any fever chills.  However he did have loose stools and concern was raised for viral myocarditis. Influenza PCR negative.  COVID-19 test has been ordered.  Continue droplet and contact precautions.  Total CK 765.  LDH 171.  Ferritin 37.  CRP less than 0.8.  History of polysubstance abuse Patient admitted to only marijuana use.  Denies any cocaine use.  Urine drug screen positive only for marijuana.  He also smokes cigarettes.  Acute diarrhea/hypokalemia Diarrhea appears to be subsiding.  Replace potassium.  Magnesium is 2.0.   DVT Prophylaxis: On IV heparin    Code Status: Full code Family Communication: Discussed with the patient Disposition Plan: Management as outlined above    LOS: 0 days   Arty Lantzy [employer] on [email or web address]  08/17/2018, 10:00 AM
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
## 2018-08-18 LAB — CBC
HCT: 46 % (ref 39.0–52.0)
Hemoglobin: 14.8 g/dL (ref 13.0–17.0)
MCH: 30.6 pg (ref 26.0–34.0)
MCHC: 32.2 g/dL (ref 30.0–36.0)
MCV: 95 fL (ref 80.0–100.0)
Platelets: 199 10*3/uL (ref 150–400)
RBC: 4.84 MIL/uL (ref 4.22–5.81)
RDW: 12.9 % (ref 11.5–15.5)
WBC: 6.3 10*3/uL (ref 4.0–10.5)
nRBC: 0 % (ref 0.0–0.2)

## 2018-08-18 LAB — CK: Total CK: 332 U/L (ref 49–397)

## 2018-08-18 LAB — COMPREHENSIVE METABOLIC PANEL
ALT: 20 U/L (ref 0–44)
AST: 34 U/L (ref 15–41)
Albumin: 3.3 g/dL — ABNORMAL LOW (ref 3.5–5.0)
Alkaline Phosphatase: 47 U/L (ref 38–126)
Anion gap: 7 (ref 5–15)
BUN: 10 mg/dL (ref 6–20)
CO2: 24 mmol/L (ref 22–32)
Calcium: 8.7 mg/dL — ABNORMAL LOW (ref 8.9–10.3)
Chloride: 107 mmol/L (ref 98–111)
Creatinine, Ser: 0.89 mg/dL (ref 0.61–1.24)
GFR calc Af Amer: >60 mL/min (ref >60)
GFR calc non Af Amer: >60 mL/min (ref >60)
Glucose, Bld: 90 mg/dL (ref 70–99)
Potassium: 4 mmol/L (ref 3.5–5.1)
Sodium: 138 mmol/L (ref 135–145)
Total Bilirubin: 0.9 mg/dL (ref 0.3–1.2)
Total Protein: 5.7 g/dL — ABNORMAL LOW (ref 6.5–8.1)

## 2018-08-18 LAB — RESPIRATORY PANEL BY PCR

## 2018-08-18 LAB — LIPID PANEL
Cholesterol: 133 mg/dL (ref 0–200)
HDL: 42 mg/dL (ref >40)
LDL Cholesterol: 85 mg/dL (ref 0–99)
Total CHOL/HDL Ratio: 3.2 RATIO
Triglycerides: 32 mg/dL (ref <150)
VLDL: 6 mg/dL (ref 0–40)

## 2018-08-18 LAB — HEPARIN LEVEL (UNFRACTIONATED): Heparin Unfractionated: 0.66 IU/mL (ref 0.30–0.70)

## 2018-08-18 LAB — TROPONIN I: Troponin I: 2.9 ng/mL (ref <0.03)

## 2018-08-18 MED ORDER — SODIUM CHLORIDE 0.9 % WEIGHT BASED INFUSION
1.0000 mL/kg/h | INTRAVENOUS | Status: DC
  Administered 2018-08-19: 1 mL/kg/h via INTRAVENOUS

## 2018-08-18 MED ORDER — SODIUM CHLORIDE 0.9% FLUSH
3.0000 mL | Freq: Two times a day (BID) | INTRAVENOUS | Status: DC
  Administered 2018-08-18 – 2018-08-20 (×5): 3 mL via INTRAVENOUS

## 2018-08-18 MED ORDER — SODIUM CHLORIDE 0.9 % IV SOLN: 250.0000 mL | INTRAVENOUS | Status: DC | PRN

## 2018-08-18 MED ORDER — SODIUM CHLORIDE 0.9 % WEIGHT BASED INFUSION
3.0000 mL/kg/h | INTRAVENOUS | Status: AC
  Administered 2018-08-19: 3 mL/kg/h via INTRAVENOUS

## 2018-08-18 MED ORDER — ATORVASTATIN CALCIUM 40 MG PO TABS
40.0000 mg | ORAL_TABLET | Freq: Every day | ORAL | Status: DC
  Administered 2018-08-18 – 2018-08-20 (×3): 40 mg via ORAL
  Filled 2018-08-18 (×3): qty 1

## 2018-08-18 MED ORDER — SODIUM CHLORIDE 0.9% FLUSH: 3.0000 mL | INTRAVENOUS | Status: DC | PRN

## 2018-08-18 NOTE — Progress Notes (Signed)
CRITICAL VALUE ALERT  Critical Value:  troponin 2.9 Date & Time Notied:  08/18/2018 0015  Provider Notified: X blount Orders Received/Actions taken: Awaiting new orders till continue to monitor   patient

## 2018-08-18 NOTE — Progress Notes (Signed)
While in doing patient assessment tried to discuss with him how a cardiac cath was done and offered for him to watch the video.  When informing him that the procedure may be done through the groin using the femoral artery or through the wrist using the radial artery pt became very anxious and nervous stating "that just ain't going to happen, going through my groin. If they try to do that I'm just not going to do it."   Tried to calm pt down and aloud him to express himself and calm his fears.  Pt states that he is a "man and you just can't do things to me like that".  Will defer to day shift RN to discuss with rounding cardiologist patients concerns.

## 2018-08-18 NOTE — Plan of Care (Signed)

## 2018-08-18 NOTE — Progress Notes (Signed)
[facility] PROGRESS NOTE  Phillip [name] FQH:[id number] DOB: Nov 21, 1980 DOA: 08/16/2018  PCP: Patient, [name]  Brief History/Interval Summary: 38 y.o. male with history h/o smoking, marijuana abuse who has not seen a PCP in recent years presented with complaints of chest pain.  's was described as a pressure-like sensation.  It was 10 out of 10 in intensity.  EKG showed nonspecific changes.  Patient had initially mild elevation in troponin.  Discussed with cardiology.  He was hospitalized.  Overnight his troponins became significantly elevated.  Placed on heparin.  There is a concern for viral myocarditis.    Reason for Visit: Chest pain. NSTEMI  Consultants: Cardiology  Procedures:  Transthoracic echocardiogram IMPRESSIONS 1. The left ventricle has normal systolic function, with an ejection fraction of 55-60%. The cavity size was normal. Left ventricular diastolic parameters were normal.  2. The right ventricle has normal systolic function. The cavity was normal. There is no increase in right ventricular wall thickness.  3. No evidence present in the left atrial appendage.  4. No pulmonic valve vegetation visualized.   Antibiotics: None  Subjective/Interval History: Patient states that he is feeling better.  He does not have any chest discomfort anymore.  No shortness of breath.  No cough.  Wondering when he can get out of the hospital.    ROS: Diarrhea appears to be slowing down  Objective:  Vital Signs  Vitals:   08/17/18 0552 08/17/18 1537 08/17/18 2111 08/18/18 0430  BP: 139/81 128/84 125/82 126/82  Pulse: (!) 53 (!) 53 (!) 57 (!) 55  Resp: 20 18 18 15   Temp: 98.5 F (36.9 C) 98.7 F (37.1 C) 98.4 F (36.9 C) 98.1 F (36.7 C)  TempSrc: Oral Oral Oral Oral  SpO2: 99% 100% 99% 100%  Weight:      Height:        Intake/Output Summary (Last 24 hours) at 08/18/2018 1101 Last data filed at 08/18/2018 0806 Gross per 24 hour  Intake 1033.71 ml  Output 1700  ml  Net -666.29 ml   Filed Weights   08/16/18 1532 08/17/18 0105  Weight: 81.6 kg 81.6 kg   General appearance: Awake alert.  In no distress Resp: Clear to auscultation bilaterally.  Normal effort Cardio: S1-S2 is normal regular.  No S3-S4.  No rubs murmurs or bruit GI: Abdomen is soft.  Nontender nondistended.  Bowel sounds are present normal.  No masses organomegaly Extremities: No edema.  Full range of motion of lower extremities. Neurologic: Alert and oriented x3.  No focal neurological deficits.    Lab Results:  Data Reviewed: I have personally reviewed following labs and imaging studies  CBC: Recent Labs  Lab 08/16/18 1553 08/17/18 0021 08/18/18 0414  WBC 10.3 9.1 6.3  HGB 15.7 14.7 14.8  HCT 47.2 43.1 46.0  MCV 92.7 93.7 95.0  PLT 203 194 199    Basic Metabolic Panel: Recent Labs  Lab 08/16/18 1604 08/17/18 0021 08/17/18 0302 08/17/18 0933 08/18/18 0414  NA 137  --  139  --  138  K 3.4*  --  3.3*  --  4.0  CL 106  --  107  --  107  CO2 20*  --  24  --  24  GLUCOSE 126*  --  94  --  90  BUN 7  --  8  --  10  CREATININE 0.90 0.92 1.03  --  0.89  CALCIUM 8.9  --  8.5*  --  8.7*  MG  --   --   --  2.0  --     GFR: Estimated Creatinine Clearance: 133.7 mL/min (by C-G formula based on SCr of 0.89 mg/dL).  Liver Function Tests: Recent Labs  Lab 08/16/18 1604 08/18/18 0414  AST 32 34  ALT 23 20  ALKPHOS 52 47  BILITOT 0.8 0.9  PROT 6.2* 5.7*  ALBUMIN 3.8 3.3*    Coagulation Profile: Recent Labs  Lab 08/17/18 0302  INR 1.1    Cardiac Enzymes: Recent Labs  Lab 08/17/18 0021 08/17/18 0302 08/17/18 0933 08/17/18 1602 08/17/18 2142 08/18/18 0414  CKTOTAL 765*  --   --   --   --  332  CKMB 63.5*  --   --   --   --   --   TROPONINI 11.01* 9.57* 5.30* 3.97* 2.90*  --      Thyroid Function Tests: Recent Labs    08/17/18 0021  TSH 2.104    Anemia Panel: Recent Labs    08/17/18 0021  FERRITIN 37     Radiology Studies: Dg  Chest Port 1 View  Result Date: 08/16/2018 CLINICAL DATA:  Left sided chest pain, shortness of breath and chills onset today. EXAM: PORTABLE CHEST 1 VIEW COMPARISON:  07/26/2016 FINDINGS: The heart size and mediastinal contours are within normal limits. Both lungs are clear. The visualized skeletal structures are unremarkable. IMPRESSION: No active disease. Electronically Signed   By: Gaylyn Rong M.D.   On: 08/16/2018 16:37     Medications:  Scheduled: . aspirin EC  81 mg Oral Daily  . atorvastatin  40 mg Oral q1800  . sodium chloride flush  3 mL Intravenous Q12H   Continuous: . sodium chloride    . [START ON 08/19/2018] sodium chloride     Followed by  . [START ON 08/19/2018] sodium chloride    . heparin 1,400 Units/hr (08/17/18 2112)  . lactated ringers 75 mL/hr at 08/17/18 1532   XBM:WUXLKG chloride, acetaminophen **OR** acetaminophen, loperamide, morphine injection, nitroGLYCERIN, ondansetron **OR** ondansetron (ZOFRAN) IV, oxyCODONE, sodium chloride flush    Assessment/Plan:  PPE Date: 08/18/2018   Patient Isolation: Droplet+Contact   HCP PPE:  wearing a facemask wearing gown & gloves wearing eye protection  Patient PPE: None   NSTEMI Patient denies any chest pain this morning.  His troponin levels peaked at 11.01 and is been trending down.  CK level normal this morning.  LDL 85.  Patient is on aspirin, IV heparin, statin.  No beta-blocker due to bradycardia. Patient without any personal risk factors for CAD except for family history of unspecified heart disease.  EKG did not show any obvious ischemic ST segment changes.  Nonspecific changes were noted.  Echocardiogram shows normal systolic function.  Discussed with cardiology.  Plan is to pursue ischemic work-up.  Colchicine has been discontinued.    Suspected COVID-19 Patient denies any exposure to anybody who has been diagnosed with COVID-19.  Patient denies any fever chills.  However he did have loose stools and  concern was raised for viral myocarditis.  However echocardiogram shows normal systolic function.  Cardiac work-up more consistent with ACS.  Influenza PCR negative.  COVID-19 is pending.  Continue droplet and contact precautions.  Total CK was 765 and then 332.  LDH 171.  Ferritin 37.  CRP less than 0.8.  History of polysubstance abuse Patient admitted to only marijuana use.  Denies any cocaine use.  Urine drug screen positive only for marijuana.  He also smokes cigarettes.  Acute diarrhea/hypokalemia Diarrhea appears to be subsiding.  Potassium  is normal this morning.  Magnesium was 2.0 yesterday.   DVT Prophylaxis: On IV heparin    Code Status: Full code Family Communication: Discussed with the patient Disposition Plan: Management as outlined above.    LOS: 1 day   Vang Kraeger [employer] on [email or web address]  08/18/2018, 11:01 AM
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
## 2018-08-19 LAB — BASIC METABOLIC PANEL
Anion gap: 5 (ref 5–15)
BUN: 10 mg/dL (ref 6–20)
CO2: 26 mmol/L (ref 22–32)
Calcium: 8.5 mg/dL — ABNORMAL LOW (ref 8.9–10.3)
Chloride: 106 mmol/L (ref 98–111)
Creatinine, Ser: 0.92 mg/dL (ref 0.61–1.24)
GFR calc Af Amer: >60 mL/min (ref >60)
GFR calc non Af Amer: >60 mL/min (ref >60)
Glucose, Bld: 91 mg/dL (ref 70–99)
Potassium: 3.8 mmol/L (ref 3.5–5.1)
Sodium: 137 mmol/L (ref 135–145)

## 2018-08-19 LAB — CBC
HCT: 47.7 % (ref 39.0–52.0)
Hemoglobin: 15.8 g/dL (ref 13.0–17.0)
MCH: 31.5 pg (ref 26.0–34.0)
MCHC: 33.1 g/dL (ref 30.0–36.0)
MCV: 95 fL (ref 80.0–100.0)
Platelets: 202 10*3/uL (ref 150–400)
RBC: 5.02 MIL/uL (ref 4.22–5.81)
RDW: 12.6 % (ref 11.5–15.5)
WBC: 6.6 10*3/uL (ref 4.0–10.5)
nRBC: 0 % (ref 0.0–0.2)

## 2018-08-19 LAB — NOVEL CORONAVIRUS, NAA (HOSP ORDER, SEND-OUT TO REF LAB; TAT 18-24 HRS): SARS-CoV-2, NAA: NOT DETECTED

## 2018-08-19 LAB — LIPOPROTEIN A (LPA): Lipoprotein (a): 194.1 nmol/L — ABNORMAL HIGH (ref <75.0)

## 2018-08-19 LAB — HEPARIN LEVEL (UNFRACTIONATED)
Heparin Unfractionated: 0.76 IU/mL — ABNORMAL HIGH (ref 0.30–0.70)
Heparin Unfractionated: 0.6 IU/mL (ref 0.30–0.70)

## 2018-08-19 MED ORDER — ALPRAZOLAM 0.5 MG PO TABS: 0.5000 mg | ORAL_TABLET | Freq: Every evening | ORAL | Status: DC | PRN

## 2018-08-19 MED ORDER — HEPARIN (PORCINE) 25000 UT/250ML-% IV SOLN
1300.0000 [IU]/h | INTRAVENOUS | Status: DC
  Administered 2018-08-19 – 2018-08-20 (×2): 1300 [IU]/h via INTRAVENOUS
  Filled 2018-08-19 (×2): qty 250

## 2018-08-19 MED ORDER — POTASSIUM CHLORIDE CRYS ER 20 MEQ PO TBCR
40.0000 meq | EXTENDED_RELEASE_TABLET | Freq: Once | ORAL | Status: AC
  Administered 2018-08-19: 40 meq via ORAL
  Filled 2018-08-19: qty 2

## 2018-08-19 NOTE — Progress Notes (Signed)
Communicated with the cath lab this morning - given the pending COVID-19 test result, it was advised that the cardiac cath be postponed. He is clinically stable/improving. Will likely need to remain hospitalized until COVID test results - at that time, we could schedule him for cath. Ok to resume diet today. Continue current medical therapy.  Chrystie Nose, MD, [facility], FACP  [place]  [facility]  Medical Director of the Advanced Lipid Disorders &  Cardiovascular Risk Reduction Clinic Diplomate of the American Board of Clinical Lipidology Attending Cardiologist  Direct Dial: [phone]  Fax: [phone]  Website:  [email or web address]

## 2018-08-19 NOTE — Progress Notes (Signed)
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 This is a significant inherited risk factor for early onset CAD. He will need follow-up with me in the advanced lipid clinic - will consider him for upcoming clinic trial of antisense-RNA to LP(a) therapy.  Chrystie Nose, MD, [facility], FACP  [place]  [facility]  Medical Director of the Advanced Lipid Disorders &  Cardiovascular Risk Reduction Clinic Diplomate of the American Board of Clinical Lipidology Attending Cardiologist  Direct Dial: [phone]  Fax: [phone]  Website:  [email or web address]

## 2018-08-19 NOTE — Progress Notes (Signed)
I have spoken with pt's PCP, nursing staff, reviewed patient's chart. They have a clear alternative diagnosis and COVID precautions are being d/c.  COVID testing negative.
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
## 2018-08-19 NOTE — Progress Notes (Signed)
[facility] PROGRESS NOTE  Phillip [name] ORV:[id number] DOB: April 11, 1981 DOA: 08/16/2018  PCP: Patient, [name]  Brief History/Interval Summary: 38 y.o. male with history h/o smoking, marijuana abuse who has not seen a PCP in recent years presented with complaints of chest pain.  's was described as a pressure-like sensation.  It was 10 out of 10 in intensity.  EKG showed nonspecific changes.  Patient had initially mild elevation in troponin.  Discussed with cardiology.  He was hospitalized.  Overnight his troponins became significantly elevated.  Placed on heparin.  There is a concern for viral myocarditis.    Reason for Visit: Chest pain. NSTEMI  Consultants: Cardiology  Procedures:  Transthoracic echocardiogram IMPRESSIONS 1. The left ventricle has normal systolic function, with an ejection fraction of 55-60%. The cavity size was normal. Left ventricular diastolic parameters were normal.  2. The right ventricle has normal systolic function. The cavity was normal. There is no increase in right ventricular wall thickness.  3. No evidence present in the left atrial appendage.  4. No pulmonic valve vegetation visualized.   Antibiotics: None  Subjective/Interval History: Patient denies any chest pain.  He was anxious about the cardiac catheterization procedure.  The procedure was explained to the patient.  He feels better.  All his questions were answered.  He was told that the cardiologist will discuss with him as well.  Patient denies any shortness of breath.  Diarrhea appears to be subsiding.    ROS: Denies any headaches.  Objective:  Vital Signs  Vitals:   08/18/18 0430 08/18/18 1245 08/18/18 2005 08/19/18 0431  BP: 126/82 121/79 131/79 124/90  Pulse: (!) 55 65 (!) 57 63  Resp: 15 16 20 15   Temp: 98.1 F (36.7 C) 98.5 F (36.9 C) 99 F (37.2 C) 98 F (36.7 C)  TempSrc: Oral Oral Oral Oral  SpO2: 100% 100% 100% 100%  Weight:      Height:         Intake/Output Summary (Last 24 hours) at 08/19/2018 0959 Last data filed at 08/19/2018 0433 Gross per 24 hour  Intake 1044.72 ml  Output 1300 ml  Net -255.28 ml   Filed Weights   08/16/18 1532 08/17/18 0105  Weight: 81.6 kg 81.6 kg   General appearance: Awake alert.  In no distress Resp: Clear to auscultation bilaterally.  Normal effort Cardio: S1-S2 is normal regular.  No S3-S4.  No rubs murmurs or bruit GI: Abdomen is soft.  Nontender nondistended.  Bowel sounds are present normal.  No masses organomegaly Extremities: No edema.  Full range of motion of lower extremities. Neurologic: Alert and oriented x3.  No focal neurological deficits.    Lab Results:  Data Reviewed: I have personally reviewed following labs and imaging studies  CBC: Recent Labs  Lab 08/16/18 1553 08/17/18 0021 08/18/18 0414 08/19/18 0442  WBC 10.3 9.1 6.3 6.6  HGB 15.7 14.7 14.8 15.8  HCT 47.2 43.1 46.0 47.7  MCV 92.7 93.7 95.0 95.0  PLT 203 194 199 202    Basic Metabolic Panel: Recent Labs  Lab 08/16/18 1604 08/17/18 0021 08/17/18 0302 08/17/18 0933 08/18/18 0414 08/19/18 0442  NA 137  --  139  --  138 137  K 3.4*  --  3.3*  --  4.0 3.8  CL 106  --  107  --  107 106  CO2 20*  --  24  --  24 26  GLUCOSE 126*  --  94  --  90 91  BUN 7  --  8  --  10 10  CREATININE 0.90 0.92 1.03  --  0.89 0.92  CALCIUM 8.9  --  8.5*  --  8.7* 8.5*  MG  --   --   --  2.0  --   --     GFR: Estimated Creatinine Clearance: 129.3 mL/min (by C-G formula based on SCr of 0.92 mg/dL).  Liver Function Tests: Recent Labs  Lab 08/16/18 1604 08/18/18 0414  AST 32 34  ALT 23 20  ALKPHOS 52 47  BILITOT 0.8 0.9  PROT 6.2* 5.7*  ALBUMIN 3.8 3.3*    Coagulation Profile: Recent Labs  Lab 08/17/18 0302  INR 1.1    Cardiac Enzymes: Recent Labs  Lab 08/17/18 0021 08/17/18 0302 08/17/18 0933 08/17/18 1602 08/17/18 2142 08/18/18 0414  CKTOTAL 765*  --   --   --   --  332  CKMB 63.5*  --   --   --    --   --   TROPONINI 11.01* 9.57* 5.30* 3.97* 2.90*  --      Thyroid Function Tests: Recent Labs    08/17/18 0021  TSH 2.104    Anemia Panel: Recent Labs    08/17/18 0021  FERRITIN 37     Radiology Studies: No results found.   Medications:  Scheduled: . aspirin EC  81 mg Oral Daily  . atorvastatin  40 mg Oral q1800  . sodium chloride flush  3 mL Intravenous Q12H   Continuous: . sodium chloride    . sodium chloride 1 mL/kg/hr (08/19/18 0544)  . heparin 1,300 Units/hr (08/19/18 0826)  . lactated ringers 50 mL/hr at 08/18/18 1959   UJW:JXBJYN chloride, acetaminophen **OR** acetaminophen, loperamide, morphine injection, nitroGLYCERIN, ondansetron **OR** ondansetron (ZOFRAN) IV, oxyCODONE, sodium chloride flush    Assessment/Plan:  PPE Date: 08/19/2018   Patient Isolation: Droplet+Contact   HCP PPE:  wearing a facemask wearing gown & gloves wearing eye protection  Patient PPE: None   NSTEMI Patient without any chest discomfort or shortness of breath this morning.  He remained stable from a cardiac standpoint.  His troponin level peaked at 11.01.  LDL 85.  Patient is on aspirin, IV heparin, statin.  No beta-blocker due to bradycardia.  Patient without any personal risk factors for CAD except for family history of unspecified heart disease.  EKG did not show any obvious ischemic ST segment changes.  Nonspecific changes were noted.  Echocardiogram shows normal systolic function.  Cardiology continues to follow the patient remotely.  Colchicine was discontinued.  Plan is to proceed with ischemic work-up once the COVID-19 test results are available.     Suspected COVID-19 Patient denies any exposure to anybody who has been diagnosed with COVID-19.  Patient denies any fever chills.  However he did have loose stools and concern was raised for viral myocarditis.  However echocardiogram shows normal systolic function.  Cardiac work-up more consistent with ACS.  Influenza PCR  negative.  COVID-19 is pending.  Continue droplet and contact precautions.  Total CK was 765 and then 332.  LDH 171.  Ferritin 37.  CRP less than 0.8.  Remains afebrile.  No respiratory symptoms currently.  History of polysubstance abuse Patient admitted to only marijuana use.  Denies any cocaine use.  Urine drug screen positive only for marijuana.  He also smokes cigarettes.  Acute diarrhea/hypokalemia Diarrhea appears to be subsiding.  Potassium is normal this morning.  Magnesium was 2.0 yesterday.   DVT Prophylaxis: On  IV heparin    Code Status: Full code Family Communication: Discussed with the patient Disposition Plan: Management as outlined above.    LOS: 2 days   Rayssa Atha Rito Ehrlich  [facility] Pager on [email or web address]  08/19/2018, 9:59 AM

## 2018-08-19 NOTE — H&P (View-Only) (Signed)
Communicated with the cath lab this morning - given the pending COVID-19 test result, it was advised that the cardiac cath be postponed. He is clinically stable/improving. Will likely need to remain hospitalized until COVID test results - at that time, we could schedule him for cath. Ok to resume diet today. Continue current medical therapy.  Jacklynn Dehaas C. Sylvie Mifsud, MD, FACC, FACP  [place]  [facility]  Medical Director of the Advanced Lipid Disorders &  Cardiovascular Risk Reduction Clinic Diplomate of the American Board of Clinical Lipidology Attending Cardiologist  Direct Dial: [phone]  Fax: [phone]  Website:  [email or web address]  

## 2018-08-20 ENCOUNTER — Encounter (HOSPITAL_COMMUNITY)
Admission: EM | Disposition: A | Discharge status: Home / Self Care | Payer: Self-pay | Source: Home / Self Care | Attending: Internal Medicine

## 2018-08-20 DIAGNOSIS — R079 Chest pain, unspecified: Secondary | ICD-10-CM

## 2018-08-20 DIAGNOSIS — I214 Non-ST elevation (NSTEMI) myocardial infarction: Principal | ICD-10-CM

## 2018-08-20 DIAGNOSIS — R7989 Other specified abnormal findings of blood chemistry: Secondary | ICD-10-CM

## 2018-08-20 HISTORY — PX: LEFT HEART CATH AND CORONARY ANGIOGRAPHY: CATH118249

## 2018-08-20 LAB — CBC
HCT: 48.9 % (ref 39.0–52.0)
Hemoglobin: 16.1 g/dL (ref 13.0–17.0)
MCH: 31 pg (ref 26.0–34.0)
MCHC: 32.9 g/dL (ref 30.0–36.0)
MCV: 94 fL (ref 80.0–100.0)
Platelets: 242 10*3/uL (ref 150–400)
RBC: 5.2 MIL/uL (ref 4.22–5.81)
RDW: 12.4 % (ref 11.5–15.5)
WBC: 6.8 10*3/uL (ref 4.0–10.5)
nRBC: 0 % (ref 0.0–0.2)

## 2018-08-20 LAB — BASIC METABOLIC PANEL
Anion gap: 7 (ref 5–15)
BUN: 12 mg/dL (ref 6–20)
CO2: 23 mmol/L (ref 22–32)
Calcium: 9.1 mg/dL (ref 8.9–10.3)
Chloride: 108 mmol/L (ref 98–111)
Creatinine, Ser: 0.97 mg/dL (ref 0.61–1.24)
GFR calc Af Amer: >60 mL/min (ref >60)
GFR calc non Af Amer: >60 mL/min (ref >60)
Glucose, Bld: 95 mg/dL (ref 70–99)
Potassium: 4 mmol/L (ref 3.5–5.1)
Sodium: 138 mmol/L (ref 135–145)

## 2018-08-20 LAB — HEPARIN LEVEL (UNFRACTIONATED): Heparin Unfractionated: 0.71 IU/mL — ABNORMAL HIGH (ref 0.30–0.70)

## 2018-08-20 SURGERY — LEFT HEART CATH AND CORONARY ANGIOGRAPHY
Anesthesia: LOCAL

## 2018-08-20 MED ORDER — HEPARIN (PORCINE) IN NACL 1000-0.9 UT/500ML-% IV SOLN
INTRAVENOUS | Status: DC | PRN
  Administered 2018-08-20 (×2): 500 mL

## 2018-08-20 MED ORDER — LIDOCAINE HCL (PF) 1 % IJ SOLN
INTRAMUSCULAR | Status: AC
  Filled 2018-08-20: qty 30

## 2018-08-20 MED ORDER — MIDAZOLAM HCL 2 MG/2ML IJ SOLN
INTRAMUSCULAR | Status: AC
  Filled 2018-08-20: qty 2

## 2018-08-20 MED ORDER — NITROGLYCERIN 1 MG/10 ML FOR IR/CATH LAB
INTRA_ARTERIAL | Status: DC | PRN
  Administered 2018-08-20: 100 ug via INTRACORONARY

## 2018-08-20 MED ORDER — HEPARIN SODIUM (PORCINE) 1000 UNIT/ML IJ SOLN
INTRAMUSCULAR | Status: DC | PRN
  Administered 2018-08-20: 4500 [IU] via INTRAVENOUS

## 2018-08-20 MED ORDER — VERAPAMIL HCL 2.5 MG/ML IV SOLN
INTRAVENOUS | Status: DC | PRN
  Administered 2018-08-20: 10 mL via INTRA_ARTERIAL

## 2018-08-20 MED ORDER — LIDOCAINE HCL (PF) 1 % IJ SOLN
INTRAMUSCULAR | Status: DC | PRN
  Administered 2018-08-20: 2 mL via INTRADERMAL

## 2018-08-20 MED ORDER — SODIUM CHLORIDE 0.9% FLUSH: 3.0000 mL | Freq: Two times a day (BID) | INTRAVENOUS | Status: DC

## 2018-08-20 MED ORDER — HYDRALAZINE HCL 20 MG/ML IJ SOLN: 10.0000 mg | INTRAMUSCULAR | Status: DC | PRN

## 2018-08-20 MED ORDER — ASPIRIN 81 MG PO CHEW: 81.0000 mg | CHEWABLE_TABLET | Freq: Every day | ORAL | 0 refills | Status: AC

## 2018-08-20 MED ORDER — HEPARIN SODIUM (PORCINE) 1000 UNIT/ML IJ SOLN
INTRAMUSCULAR | Status: AC
  Filled 2018-08-20: qty 1

## 2018-08-20 MED ORDER — HEPARIN SODIUM (PORCINE) 5000 UNIT/ML IJ SOLN: 5000.0000 [IU] | Freq: Three times a day (TID) | INTRAMUSCULAR | Status: DC

## 2018-08-20 MED ORDER — SODIUM CHLORIDE 0.9 % IV SOLN: INTRAVENOUS | Status: DC

## 2018-08-20 MED ORDER — ATORVASTATIN CALCIUM 40 MG PO TABS: 40.0000 mg | ORAL_TABLET | Freq: Every day | ORAL | 0 refills | Status: DC

## 2018-08-20 MED ORDER — NITROGLYCERIN 1 MG/10 ML FOR IR/CATH LAB
INTRA_ARTERIAL | Status: AC
  Filled 2018-08-20: qty 10

## 2018-08-20 MED ORDER — SODIUM CHLORIDE 0.9% FLUSH: 3.0000 mL | INTRAVENOUS | Status: DC | PRN

## 2018-08-20 MED ORDER — VERAPAMIL HCL 2.5 MG/ML IV SOLN
INTRAVENOUS | Status: AC
  Filled 2018-08-20: qty 2

## 2018-08-20 MED ORDER — LABETALOL HCL 5 MG/ML IV SOLN
10.0000 mg | INTRAVENOUS | Status: DC | PRN
  Filled 2018-08-20: qty 4

## 2018-08-20 MED ORDER — IOHEXOL 350 MG/ML SOLN
INTRAVENOUS | Status: DC | PRN
  Administered 2018-08-20: 11:00:00 75 mL via INTRACARDIAC

## 2018-08-20 MED ORDER — ACETAMINOPHEN 325 MG PO TABS: 650.0000 mg | ORAL_TABLET | ORAL | Status: DC | PRN

## 2018-08-20 MED ORDER — SODIUM CHLORIDE 0.9 % IV SOLN
INTRAVENOUS | Status: AC | PRN
  Administered 2018-08-20: 250 mL via INTRAVENOUS

## 2018-08-20 MED ORDER — ASPIRIN 81 MG PO CHEW
81.0000 mg | CHEWABLE_TABLET | Freq: Every day | ORAL | Status: DC
  Administered 2018-08-20: 81 mg via ORAL
  Filled 2018-08-20: qty 1

## 2018-08-20 MED ORDER — ONDANSETRON HCL 4 MG/2ML IJ SOLN: 4.0000 mg | Freq: Four times a day (QID) | INTRAMUSCULAR | Status: DC | PRN

## 2018-08-20 MED ORDER — SODIUM CHLORIDE 0.9 % IV SOLN: 250.0000 mL | INTRAVENOUS | Status: DC | PRN

## 2018-08-20 MED ORDER — FENTANYL CITRATE (PF) 100 MCG/2ML IJ SOLN
INTRAMUSCULAR | Status: DC | PRN
  Administered 2018-08-20: 25 ug via INTRAVENOUS

## 2018-08-20 MED ORDER — HEPARIN (PORCINE) IN NACL 1000-0.9 UT/500ML-% IV SOLN
INTRAVENOUS | Status: AC
  Filled 2018-08-20: qty 1000

## 2018-08-20 MED ORDER — FENTANYL CITRATE (PF) 100 MCG/2ML IJ SOLN
INTRAMUSCULAR | Status: AC
  Filled 2018-08-20: qty 2

## 2018-08-20 MED ORDER — MIDAZOLAM HCL 2 MG/2ML IJ SOLN
INTRAMUSCULAR | Status: DC | PRN
  Administered 2018-08-20: 1 mg via INTRAVENOUS

## 2018-08-20 SURGICAL SUPPLY — 12 items
CATH INFINITI 5 FR JL3.5 (CATHETERS) ×1 IMPLANT
CATH INFINITI JR4 5F (CATHETERS) ×1 IMPLANT
COVER DOME SNAP 22 D (MISCELLANEOUS) ×1 IMPLANT
DEVICE RAD COMP TR BAND LRG (VASCULAR PRODUCTS) ×1 IMPLANT
GLIDESHEATH SLEND A-KIT 6F 22G (SHEATH) ×1 IMPLANT
GUIDEWIRE INQWIRE 1.5J.035X260 (WIRE) IMPLANT
INQWIRE 1.5J .035X260CM (WIRE) ×2
KIT HEART LEFT (KITS) ×2 IMPLANT
PACK CARDIAC CATHETERIZATION (CUSTOM PROCEDURE TRAY) ×2 IMPLANT
SHEATH PROBE COVER 6X72 (BAG) ×1 IMPLANT
TRANSDUCER W/STOPCOCK (MISCELLANEOUS) ×2 IMPLANT
TUBING CIL FLEX 10 FLL-RA (TUBING) ×2 IMPLANT

## 2018-08-20 NOTE — Interval H&P Note (Signed)
Cath Lab Visit (complete for each Cath Lab visit)  Clinical Evaluation Leading to the Procedure:   ACS: Yes.    Non-ACS:    Anginal Classification: CCS IV  Anti-ischemic medical therapy: Minimal Therapy (1 class of medications)  Non-Invasive Test Results: No non-invasive testing performed  Prior CABG: No previous CABG      History and Physical Interval Note:  08/20/2018 7:56 AM  Phillip [name]  has presented today for surgery, with the diagnosis of NSTEMI.  The various methods of treatment have been discussed with the patient and family. After consideration of risks, benefits and other options for treatment, the patient has consented to  Procedure(s): LEFT HEART CATH AND CORONARY ANGIOGRAPHY (N/A) as a surgical intervention.  The patient's history has been reviewed, patient examined, no change in status, stable for surgery.  I have reviewed the patient's chart and labs.  Questions were answered to the patient's satisfaction.     Lyn Records III
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
## 2018-08-20 NOTE — Progress Notes (Signed)
TR band removed at 1306. Site level 0. Right radial pulse palpable and sensation in fingers. Patient alert, oriented, and able to understand instructions. This RN gave report to Cathie Hoops, RN [phone]. [facility] is here to transport the patient.

## 2018-08-20 NOTE — Discharge Instructions (Signed)

## 2018-08-20 NOTE — Discharge Summary (Signed)
Physician Discharge Summary  Phillip [name] ZOX:[id number] DOB: 1980/08/24 DOA: 08/16/2018  PCP: Patient, [name]  Admit date: 08/16/2018 Discharge date: 08/20/2018  Admitted From: Home Discharge disposition: Home   Code Status: Full Code   Recommendations for Outpatient Follow-Up:   1. Follow-up with cardiology as an outpatient  Discharge Diagnosis:   Active Problems:   * No active hospital problems. *  History of Present Illness / Brief narrative:  Patient is a 38 y.o.malewith history h/osmoking, marijuana abuse who has not seen a PCP in recent years presented with complaints of chest pain/pressure. EKG showed nonspecific changes.  Troponin elevated up to 11. Cardiology consult was obtained.  Patient was started on heparin drip.  COVID-19 infection was considered earlier.  Test ultimately negative.  Hospital Course:  NSTEMI - Presented with typical symptoms of chest pain/pressure. - Troponin peaked at 11 and trended down.  Echocardiogram showed normal LV ejection fraction.  LDL 85.  - Treated with IV heparin, aspirin and statin.  - Underwent cardiac cath today.  No evidence of obstructive coronary artery disease. 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   - Per cardiology recommendation, patient needs aggressive secondary risk factor modification including lipid management, LDL target of 70 (currently 85), screening for diabetes mellitus, possible sleep apnea, encouragement for aerobic activity and avoidance of substance abuse. - We will start the patient home on aspirin and statin. - He will follow-up with cardiology as an outpatient. - Counseled to quit smoking.  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  Per cardiology, this is a significant inherited risk factor for early onset CAD.  He will follow-up with cardiology as an outpatient.  He will be considered for an upcoming clinical trial of antisense RNA.   History of polysubstance abuse - Patient admitted to only  marijuana use.  Denies any cocaine use.  Urine drug screen positive only for marijuana.  He also smokes cigarettes.  Counseled to quit.  Acute diarrhea/hypokalemia - Diarrhea subsiding.  Electrolyte replaced.  Stable for discharge to home today.  Medical Consultants:    Cardiology  Subjective:  Patient was seen and examined this afternoon.  [name].  Not in distress. [facility]  Discharge Exam:   Vitals:   08/20/18 1205 08/20/18 1235 08/20/18 1305 08/20/18 1401  BP: 128/74 (!) 133/91 112/75 129/90  Pulse: (!) 53 (!) 57 (!) 58 (!) 54  Resp: 18 16 14 18   Temp:    98.9 F (37.2 C)  TempSrc:    Oral  SpO2: 100% 99% 98% 99%  Weight:      Height:        Body mass index is 23.11 kg/m.  General exam: Appears calm and comfortable.  No distress Skin: No rashes, lesions or ulcers. HEENT: Normal Lungs: Clear to auscultation bilaterally CVS: Regular rate and rhythm, no murmur GI/Abd soft, nontender, nondistended, bowel sound present CNS: Alert, awake, oriented x3 Psychiatry: Mood & affect appropriate.  Extremities: No pedal edema, no calf tenderness  Discharge Instructions:  Wound care: None Discharge Instructions    Diet - low sodium heart healthy   Complete by:  As directed    Increase activity slowly   Complete by:  As directed      Follow-up Information    Rollene RotundaHochrein, James, MD Follow up.   Specialty:  Cardiology Contact information: [address] [place] [address] [phone]  Allergies as of 08/20/2018   No Known Allergies     Medication List    STOP taking these medications   amoxicillin 500 MG capsule Commonly known as:  AMOXIL   cephALEXin 500 MG capsule Commonly known as:  Keflex   doxycycline 100 MG capsule Commonly known as:  VIBRAMYCIN   ibuprofen 800 MG tablet Commonly known as:  ADVIL,MOTRIN   meloxicam 15 MG tablet Commonly known as:  Mobic      TAKE these medications   aspirin 81 MG chewable tablet Chew 1 tablet (81 mg total) by mouth daily for 30 days. Start taking on:  August 21, 2018   atorvastatin 40 MG tablet Commonly known as:  LIPITOR Take 1 tablet (40 mg total) by mouth daily at 6 PM for 30 days.   benzonatate 100 MG capsule Commonly known as:  TESSALON Take 1 capsule (100 mg total) by mouth every 8 (eight) hours.   cyclobenzaprine 10 MG tablet Commonly known as:  FLEXERIL Take 1 tablet (10 mg total) by mouth 2 (two) times daily as needed for muscle spasms.   oxymetazoline 0.05 % nasal spray Commonly known as:  Afrin Nasal Spray Place 1 spray into both nostrils 2 (two) times daily. Spray once into each nostril twice daily for up to the next 3 days. Do not use for more than 3 days to prevent rebound rhinorrhea.   traMADol 50 MG tablet Commonly known as:  ULTRAM Take 1 tablet (50 mg total) by mouth every 6 (six) hours as needed.       Time coordinating discharge: 25 minutes  The results of significant diagnostics from this hospitalization (including imaging, microbiology, ancillary and laboratory) are listed below for reference.    Procedures and Diagnostic Studies:   Dg Chest Port 1 View  Result Date: 08/16/2018 CLINICAL DATA:  Left sided chest pain, shortness of breath and chills onset today. EXAM: PORTABLE CHEST 1 VIEW COMPARISON:  07/26/2016 FINDINGS: The heart size and mediastinal contours are within normal limits. Both lungs are clear. The visualized skeletal structures are unremarkable. IMPRESSION: No active disease. Electronically Signed   By: Gaylyn Rong M.D.   On: 08/16/2018 16:37     Labs:   Basic Metabolic Panel: Recent Labs  Lab 08/16/18 1604 08/17/18 0021 08/17/18 0302 08/17/18 0933 08/18/18 0414 08/19/18 0442 08/20/18 0407  NA 137  --  139  --  138 137 138  K 3.4*  --  3.3*  --  4.0 3.8 4.0  CL 106  --  107  --  107 106 108  CO2 20*  --  24  --  24 26 23   GLUCOSE 126*  --   94  --  90 91 95  BUN 7  --  8  --  10 10 12   CREATININE 0.90 0.92 1.03  --  0.89 0.92 0.97  CALCIUM 8.9  --  8.5*  --  8.7* 8.5* 9.1  MG  --   --   --  2.0  --   --   --    GFR Estimated Creatinine Clearance: 122.7 mL/min (by C-G formula based on SCr of 0.97 mg/dL). Liver Function Tests: Recent Labs  Lab 08/16/18 1604 08/18/18 0414  AST 32 34  ALT 23 20  ALKPHOS 52 47  BILITOT 0.8 0.9  PROT 6.2* 5.7*  ALBUMIN 3.8 3.3*   No results for input(s): LIPASE, AMYLASE in the last 168 hours. No results for input(s): AMMONIA in the last 168 hours.  Coagulation profile Recent Labs  Lab 08/17/18 0302  INR 1.1    CBC: Recent Labs  Lab 08/16/18 1553 08/17/18 0021 08/18/18 0414 08/19/18 0442 08/20/18 0407  WBC 10.3 9.1 6.3 6.6 6.8  HGB 15.7 14.7 14.8 15.8 16.1  HCT 47.2 43.1 46.0 47.7 48.9  MCV 92.7 93.7 95.0 95.0 94.0  PLT 203 194 199 202 242   Cardiac Enzymes: Recent Labs  Lab 08/17/18 0021 08/17/18 0302 08/17/18 0933 08/17/18 1602 08/17/18 2142 08/18/18 0414  CKTOTAL 765*  --   --   --   --  332  CKMB 63.5*  --   --   --   --   --   TROPONINI 11.01* 9.57* 5.30* 3.97* 2.90*  --    BNP: Invalid input(s): POCBNP CBG: No results for input(s): GLUCAP in the last 168 hours. D-Dimer No results for input(s): DDIMER in the last 72 hours. Hgb A1c No results for input(s): HGBA1C in the last 72 hours. Lipid Profile Recent Labs    08/18/18 0414  CHOL 133  HDL 42  LDLCALC 85  TRIG 32  CHOLHDL 3.2   Thyroid function studies No results for input(s): TSH, T4TOTAL, T3FREE, THYROIDAB in the last 72 hours.  Invalid input(s): FREET3 Anemia work up No results for input(s): VITAMINB12, FOLATE, FERRITIN, TIBC, IRON, RETICCTPCT in the last 72 hours. Microbiology Recent Results (from the past 240 hour(s))  Respiratory Panel by PCR     Status: None   Collection Time: 08/16/18  8:09 PM  Result Value Ref Range Status   Adenovirus NOT DETECTED NOT DETECTED Final    Coronavirus 229E NOT DETECTED NOT DETECTED Final    Comment: (NOTE) The Coronavirus on the Respiratory Panel, DOES NOT test for the novel  Coronavirus (2019 nCoV)    Coronavirus HKU1 NOT DETECTED NOT DETECTED Final   Coronavirus NL63 NOT DETECTED NOT DETECTED Final   Coronavirus OC43 NOT DETECTED NOT DETECTED Final   Metapneumovirus NOT DETECTED NOT DETECTED Final   Rhinovirus / Enterovirus NOT DETECTED NOT DETECTED Final   Influenza A NOT DETECTED NOT DETECTED Final   Influenza B NOT DETECTED NOT DETECTED Final   Parainfluenza Virus 1 NOT DETECTED NOT DETECTED Final   Parainfluenza Virus 2 NOT DETECTED NOT DETECTED Final   Parainfluenza Virus 3 NOT DETECTED NOT DETECTED Final   Parainfluenza Virus 4 NOT DETECTED NOT DETECTED Final   Respiratory Syncytial Virus NOT DETECTED NOT DETECTED Final   Bordetella pertussis NOT DETECTED NOT DETECTED Final   Chlamydophila pneumoniae NOT DETECTED NOT DETECTED Final   Mycoplasma pneumoniae NOT DETECTED NOT DETECTED Final    Comment: Performed at [facility], [address], [place], [place] [address]  Novel Coronavirus, NAA (hospital order; send-out to ref lab)     Status: None   Collection Time: 08/17/18  9:33 AM  Result Value Ref Range Status   SARS-CoV-2, NAA NOT DETECTED NOT DETECTED Final    Comment: Performed at [facility] Performed at [facility], [address], [place], [place] [address]    Coronavirus Source NASOPHARYNGEAL  Final    Comment: Performed at [facility], [address], [place], [place] [address]    Signed: Melina Schools Milca Sytsma  [facility] 08/20/2018, 2:56 PM

## 2018-08-20 NOTE — Progress Notes (Signed)
DC instructions/ packet have been gone over with patient. Pt. Has verbalized understanding of the instructions has no questions at this time.Both peripheral  IV have been removed and are in WDL.

## 2018-08-20 NOTE — CV Procedure (Signed)
   Coronary angiography and left heart catheterization performed via right radial approach using real-time vascular ultrasound guidance for access.  Normal coronary arteries.  Normal left ventricular hemodynamics.  LVEDP 8 mmHg.  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  There is a possibility of inflammatory myocardial process as well.  The only way to further identify etiology would be a cardiac MRI to rule out inflammation as with myocarditis, sarcoid, etc.  Needs aggressive secondary risk factor modification including management of lipids as outlined by Dr. Rennis Golden.  These measures should include screening for diabetes, smoking cessation, LDL target of 70, screening for the possibility of sleep apnea, encouragement of moderate aerobic activity greater than 150 minutes/week, and avoidance of substances that may have cardiotoxic effect.

## 2018-08-21 ENCOUNTER — Encounter (HOSPITAL_COMMUNITY): Payer: Self-pay | Admitting: Interventional Cardiology

## 2018-08-23 LAB — STOOL CULTURE REFLEX - RSASHR

## 2018-08-23 LAB — STOOL CULTURE REFLEX - CMPCXR

## 2018-08-23 LAB — STOOL CULTURE: E coli, Shiga toxin Assay: NEGATIVE

## 2019-03-11 ENCOUNTER — Encounter (HOSPITAL_COMMUNITY): Payer: Self-pay | Admitting: Emergency Medicine

## 2019-03-11 ENCOUNTER — Emergency Department (HOSPITAL_COMMUNITY): Payer: Medicaid Other

## 2019-03-11 ENCOUNTER — Other Ambulatory Visit: Payer: Self-pay

## 2019-03-11 ENCOUNTER — Emergency Department (HOSPITAL_COMMUNITY)
Admission: EM | Admit: 2019-03-11 | Discharge: 2019-03-11 | Disposition: A | Discharge status: Home / Self Care | Payer: Medicaid Other | Attending: Emergency Medicine | Admitting: Emergency Medicine

## 2019-03-11 DIAGNOSIS — R0789 Other chest pain: Secondary | ICD-10-CM | POA: Diagnosis not present

## 2019-03-11 DIAGNOSIS — R079 Chest pain, unspecified: Secondary | ICD-10-CM | POA: Diagnosis not present

## 2019-03-11 DIAGNOSIS — Z951 Presence of aortocoronary bypass graft: Secondary | ICD-10-CM | POA: Diagnosis not present

## 2019-03-11 DIAGNOSIS — Z87891 Personal history of nicotine dependence: Secondary | ICD-10-CM | POA: Insufficient documentation

## 2019-03-11 DIAGNOSIS — I252 Old myocardial infarction: Secondary | ICD-10-CM | POA: Diagnosis not present

## 2019-03-11 DIAGNOSIS — R1013 Epigastric pain: Secondary | ICD-10-CM

## 2019-03-11 DIAGNOSIS — Z720 Tobacco use: Secondary | ICD-10-CM

## 2019-03-11 LAB — BASIC METABOLIC PANEL
Anion gap: 8 (ref 5–15)
BUN: 11 mg/dL (ref 6–20)
CO2: 23 mmol/L (ref 22–32)
Calcium: 8.8 mg/dL — ABNORMAL LOW (ref 8.9–10.3)
Chloride: 107 mmol/L (ref 98–111)
Creatinine, Ser: 0.93 mg/dL (ref 0.61–1.24)
GFR calc Af Amer: >60 mL/min (ref >60)
GFR calc non Af Amer: >60 mL/min (ref >60)
Glucose, Bld: 136 mg/dL — ABNORMAL HIGH (ref 70–99)
Potassium: 3.9 mmol/L (ref 3.5–5.1)
Sodium: 138 mmol/L (ref 135–145)

## 2019-03-11 LAB — CBC
HCT: 43.5 % (ref 39.0–52.0)
Hemoglobin: 14.3 g/dL (ref 13.0–17.0)
MCH: 31.8 pg (ref 26.0–34.0)
MCHC: 32.9 g/dL (ref 30.0–36.0)
MCV: 96.9 fL (ref 80.0–100.0)
Platelets: 218 10*3/uL (ref 150–400)
RBC: 4.49 MIL/uL (ref 4.22–5.81)
RDW: 12.6 % (ref 11.5–15.5)
WBC: 5.5 10*3/uL (ref 4.0–10.5)
nRBC: 0 % (ref 0.0–0.2)

## 2019-03-11 LAB — TROPONIN I (HIGH SENSITIVITY): Troponin I (High Sensitivity): 3 ng/L (ref <18)

## 2019-03-11 MED ORDER — SUCRALFATE 1 G PO TABS: 1.0000 g | ORAL_TABLET | Freq: Four times a day (QID) | ORAL | 0 refills | Status: DC

## 2019-03-11 MED ORDER — ATORVASTATIN CALCIUM 40 MG PO TABS: 40.0000 mg | ORAL_TABLET | Freq: Every day | ORAL | 2 refills | Status: DC

## 2019-03-11 MED ORDER — OMEPRAZOLE 20 MG PO CPDR: 20.0000 mg | DELAYED_RELEASE_CAPSULE | Freq: Every day | ORAL | 0 refills | Status: DC

## 2019-03-11 MED ORDER — LIDOCAINE VISCOUS HCL 2 % MT SOLN
15.0000 mL | Freq: Once | OROMUCOSAL | Status: AC
  Administered 2019-03-11: 23:00:00 15 mL via ORAL
  Filled 2019-03-11: qty 15

## 2019-03-11 MED ORDER — ALUM & MAG HYDROXIDE-SIMETH 200-200-20 MG/5ML PO SUSP
30.0000 mL | Freq: Once | ORAL | Status: AC
  Administered 2019-03-11: 30 mL via ORAL
  Filled 2019-03-11: qty 30

## 2019-03-11 MED ORDER — ASPIRIN 81 MG PO CHEW: 81.0000 mg | CHEWABLE_TABLET | Freq: Every day | ORAL | 0 refills | Status: AC

## 2019-03-11 NOTE — ED Notes (Signed)
Pt was verbalized discharge instructions. Pt had no further questions at this time. NAD. 

## 2019-03-11 NOTE — ED Notes (Signed)
Patient has extra blood in the main lab one gold and one blue 

## 2019-03-11 NOTE — ED Triage Notes (Signed)
Pt c/o lefts ide chest pains that feel like when he had MI back in April this year, reports pain hit him one time and went through to his back.

## 2019-03-11 NOTE — Discharge Instructions (Addendum)
Thank you for allowing me to care for you today in the Emergency Department.   Call tomorrow to schedule a follow up appointment with Dr. Percival Spanish with cardiology.   I have given you a prescription to restart your atorvastatin, which you should take 1 tablet by mouth daily and 1 chewable aspirin daily.  Use caution with frequent NSAID use, this includes medication such as ibuprofen and Aleve.  Make sure that you are was taken with food to avoid increasing her risk of getting an ulcer.  Try taking 1 tablet of omeprazole daily for the next 2 weeks and 1 tablet of Carafate every 6 hours to see if this will help some of the upper abdominal pain that you have been having.  I would recommend trying to cut back on cigarettes by 1 daily every week to help you stop smoking.  You should return to the emergency department if you develop chest pain that becomes persistent but is worse with exertion, sweating, severe shortness of breath, vomiting, numbness or weakness, or if you develop black or bloody stools or vomiting.
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
## 2019-03-12 LAB — TROPONIN I (HIGH SENSITIVITY): Troponin I (High Sensitivity): 3 ng/L (ref <18)

## 2019-10-13 ENCOUNTER — Emergency Department (HOSPITAL_COMMUNITY): Payer: Medicaid Other

## 2019-10-13 ENCOUNTER — Emergency Department (HOSPITAL_COMMUNITY)
Admission: EM | Admit: 2019-10-13 | Discharge: 2019-10-14 | Disposition: A | Discharge status: Home / Self Care | Payer: Medicaid Other | Attending: Emergency Medicine | Admitting: Emergency Medicine

## 2019-10-13 ENCOUNTER — Encounter (HOSPITAL_COMMUNITY): Payer: Self-pay | Admitting: Emergency Medicine

## 2019-10-13 ENCOUNTER — Other Ambulatory Visit: Payer: Self-pay

## 2019-10-13 DIAGNOSIS — Y999 Unspecified external cause status: Secondary | ICD-10-CM | POA: Diagnosis not present

## 2019-10-13 DIAGNOSIS — X500XXA Overexertion from strenuous movement or load, initial encounter: Secondary | ICD-10-CM | POA: Insufficient documentation

## 2019-10-13 DIAGNOSIS — M25571 Pain in right ankle and joints of right foot: Secondary | ICD-10-CM | POA: Diagnosis not present

## 2019-10-13 DIAGNOSIS — Z79899 Other long term (current) drug therapy: Secondary | ICD-10-CM | POA: Insufficient documentation

## 2019-10-13 DIAGNOSIS — Y92310 Basketball court as the place of occurrence of the external cause: Secondary | ICD-10-CM | POA: Diagnosis not present

## 2019-10-13 DIAGNOSIS — Z87891 Personal history of nicotine dependence: Secondary | ICD-10-CM | POA: Insufficient documentation

## 2019-10-13 DIAGNOSIS — Y9367 Activity, basketball: Secondary | ICD-10-CM | POA: Insufficient documentation

## 2019-10-13 DIAGNOSIS — S86001A Unspecified injury of right Achilles tendon, initial encounter: Secondary | ICD-10-CM | POA: Insufficient documentation

## 2019-10-13 DIAGNOSIS — S8991XA Unspecified injury of right lower leg, initial encounter: Secondary | ICD-10-CM | POA: Diagnosis present

## 2019-10-13 MED ORDER — OXYCODONE-ACETAMINOPHEN 5-325 MG PO TABS
1.0000 | ORAL_TABLET | Freq: Once | ORAL | Status: AC
  Administered 2019-10-13: 1 via ORAL
  Filled 2019-10-13: qty 1

## 2019-10-13 MED ORDER — OXYCODONE-ACETAMINOPHEN 5-325 MG PO TABS: 1.0000 | ORAL_TABLET | ORAL | 0 refills | Status: DC | PRN

## 2019-10-13 MED ORDER — IBUPROFEN 800 MG PO TABS: 800.0000 mg | ORAL_TABLET | Freq: Three times a day (TID) | ORAL | 0 refills | Status: DC
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
## 2019-10-13 NOTE — Discharge Instructions (Signed)
Leave splint in place, use crutches to keep your weight off right foot. Take the prescribed medication as directed.  Be careful when taking percocet, it can make you sleepy.  You can reserve for night time use if you like. Follow-up with Dr. Victorino Dike-- I would call his office in the morning to get appointment scheduled. Return to the ED for new or worsening symptoms.

## 2019-10-13 NOTE — ED Triage Notes (Signed)
Patient injured his right lower calf  while playing basketball this evening , reports pain with muscle tightness , ambulatory/no deformity .

## 2019-10-14 NOTE — Progress Notes (Signed)
Orthopedic Tech Progress Note Patient Details:  Phillip [name] 23-Aug-1980 [id number]  Ortho Devices Type of Ortho Device: Crutches, [name] (short leg) splint Ortho Device/Splint Location: rle Ortho Device/Splint Interventions: Ordered, Application, Adjustment   [name] Interventions Patient Tolerated: Well Instructions Provided: Care of device, Adjustment of device   Phillip [name] 10/14/2019, 1:07 AM

## 2019-10-16 DIAGNOSIS — M25571 Pain in right ankle and joints of right foot: Secondary | ICD-10-CM | POA: Diagnosis not present

## 2019-10-21 DIAGNOSIS — M25571 Pain in right ankle and joints of right foot: Secondary | ICD-10-CM | POA: Diagnosis not present

## 2019-10-29 DIAGNOSIS — S86091A Other specified injury of right Achilles tendon, initial encounter: Secondary | ICD-10-CM | POA: Diagnosis not present

## 2019-11-12 DIAGNOSIS — S86091A Other specified injury of right Achilles tendon, initial encounter: Secondary | ICD-10-CM | POA: Diagnosis not present

## 2019-11-26 DIAGNOSIS — S86091A Other specified injury of right Achilles tendon, initial encounter: Secondary | ICD-10-CM | POA: Diagnosis not present

## 2020-01-15 ENCOUNTER — Emergency Department (HOSPITAL_COMMUNITY)
Admission: EM | Admit: 2020-01-15 | Discharge: 2020-01-15 | Disposition: A | Discharge status: Home / Self Care | Payer: Medicaid Other | Attending: Emergency Medicine | Admitting: Emergency Medicine

## 2020-01-15 ENCOUNTER — Emergency Department (HOSPITAL_COMMUNITY): Payer: Medicaid Other

## 2020-01-15 ENCOUNTER — Other Ambulatory Visit: Payer: Self-pay

## 2020-01-15 ENCOUNTER — Encounter (HOSPITAL_COMMUNITY): Payer: Self-pay

## 2020-01-15 DIAGNOSIS — R109 Unspecified abdominal pain: Secondary | ICD-10-CM | POA: Diagnosis not present

## 2020-01-15 DIAGNOSIS — S39012A Strain of muscle, fascia and tendon of lower back, initial encounter: Secondary | ICD-10-CM | POA: Diagnosis not present

## 2020-01-15 DIAGNOSIS — R0981 Nasal congestion: Secondary | ICD-10-CM | POA: Diagnosis not present

## 2020-01-15 DIAGNOSIS — S3992XA Unspecified injury of lower back, initial encounter: Secondary | ICD-10-CM | POA: Diagnosis present

## 2020-01-15 DIAGNOSIS — R300 Dysuria: Secondary | ICD-10-CM | POA: Insufficient documentation

## 2020-01-15 DIAGNOSIS — Z23 Encounter for immunization: Secondary | ICD-10-CM | POA: Insufficient documentation

## 2020-01-15 DIAGNOSIS — Z7189 Other specified counseling: Secondary | ICD-10-CM | POA: Diagnosis not present

## 2020-01-15 DIAGNOSIS — Y929 Unspecified place or not applicable: Secondary | ICD-10-CM | POA: Diagnosis not present

## 2020-01-15 DIAGNOSIS — Z7185 Encounter for immunization safety counseling: Secondary | ICD-10-CM

## 2020-01-15 DIAGNOSIS — Z87891 Personal history of nicotine dependence: Secondary | ICD-10-CM | POA: Diagnosis not present

## 2020-01-15 DIAGNOSIS — X501XXA Overexertion from prolonged static or awkward postures, initial encounter: Secondary | ICD-10-CM | POA: Diagnosis not present

## 2020-01-15 DIAGNOSIS — Y999 Unspecified external cause status: Secondary | ICD-10-CM | POA: Diagnosis not present

## 2020-01-15 DIAGNOSIS — Z79899 Other long term (current) drug therapy: Secondary | ICD-10-CM | POA: Insufficient documentation

## 2020-01-15 DIAGNOSIS — Y9389 Activity, other specified: Secondary | ICD-10-CM | POA: Insufficient documentation

## 2020-01-15 DIAGNOSIS — Z20822 Contact with and (suspected) exposure to covid-19: Secondary | ICD-10-CM | POA: Diagnosis not present

## 2020-01-15 LAB — URINALYSIS, ROUTINE W REFLEX MICROSCOPIC
Bacteria, UA: NONE SEEN
Bilirubin Urine: NEGATIVE
Glucose, UA: NEGATIVE mg/dL
Ketones, ur: NEGATIVE mg/dL
Leukocytes,Ua: NEGATIVE
Nitrite: NEGATIVE
Protein, ur: NEGATIVE mg/dL
Specific Gravity, Urine: 1.016 (ref 1.005–1.030)
pH: 5 (ref 5.0–8.0)

## 2020-01-15 LAB — COMPREHENSIVE METABOLIC PANEL
ALT: 16 U/L (ref 0–44)
AST: 26 U/L (ref 15–41)
Albumin: 4.1 g/dL (ref 3.5–5.0)
Alkaline Phosphatase: 64 U/L (ref 38–126)
Anion gap: 9 (ref 5–15)
BUN: 8 mg/dL (ref 6–20)
CO2: 27 mmol/L (ref 22–32)
Calcium: 9.4 mg/dL (ref 8.9–10.3)
Chloride: 102 mmol/L (ref 98–111)
Creatinine, Ser: 1.13 mg/dL (ref 0.61–1.24)
GFR calc Af Amer: >60 mL/min (ref >60)
GFR calc non Af Amer: >60 mL/min (ref >60)
Glucose, Bld: 95 mg/dL (ref 70–99)
Potassium: 4.5 mmol/L (ref 3.5–5.1)
Sodium: 138 mmol/L (ref 135–145)
Total Bilirubin: 1.1 mg/dL (ref 0.3–1.2)
Total Protein: 7.4 g/dL (ref 6.5–8.1)

## 2020-01-15 LAB — CBC WITH DIFFERENTIAL/PLATELET
Abs Immature Granulocytes: 0.01 10*3/uL (ref 0.00–0.07)
Basophils Absolute: 0 10*3/uL (ref 0.0–0.1)
Basophils Relative: 1 %
Eosinophils Absolute: 0.1 10*3/uL (ref 0.0–0.5)
Eosinophils Relative: 1 %
HCT: 45 % (ref 39.0–52.0)
Hemoglobin: 14.7 g/dL (ref 13.0–17.0)
Immature Granulocytes: 0 %
Lymphocytes Relative: 22 %
Lymphs Abs: 1.3 10*3/uL (ref 0.7–4.0)
MCH: 30.9 pg (ref 26.0–34.0)
MCHC: 32.7 g/dL (ref 30.0–36.0)
MCV: 94.7 fL (ref 80.0–100.0)
Monocytes Absolute: 0.8 10*3/uL (ref 0.1–1.0)
Monocytes Relative: 14 %
Neutro Abs: 3.6 10*3/uL (ref 1.7–7.7)
Neutrophils Relative %: 62 %
Platelets: 228 10*3/uL (ref 150–400)
RBC: 4.75 MIL/uL (ref 4.22–5.81)
RDW: 13 % (ref 11.5–15.5)
WBC: 5.8 10*3/uL (ref 4.0–10.5)
nRBC: 0 % (ref 0.0–0.2)

## 2020-01-15 LAB — SARS CORONAVIRUS 2 BY RT PCR (HOSPITAL ORDER, PERFORMED IN ~~LOC~~ HOSPITAL LAB): SARS Coronavirus 2: NEGATIVE

## 2020-01-15 MED ORDER — PREDNISONE 10 MG (21) PO TBPK: ORAL_TABLET | ORAL | 0 refills | Status: DC

## 2020-01-15 MED ORDER — KETOROLAC TROMETHAMINE 30 MG/ML IJ SOLN
30.0000 mg | Freq: Once | INTRAMUSCULAR | Status: AC
  Administered 2020-01-15: 30 mg via INTRAVENOUS
  Filled 2020-01-15: qty 1

## 2020-01-15 MED ORDER — METHOCARBAMOL 500 MG PO TABS: 500.0000 mg | ORAL_TABLET | Freq: Two times a day (BID) | ORAL | 0 refills | Status: DC

## 2020-01-15 MED ORDER — IBUPROFEN 800 MG PO TABS: 800.0000 mg | ORAL_TABLET | Freq: Three times a day (TID) | ORAL | 0 refills | Status: DC

## 2020-01-15 MED ORDER — ONDANSETRON HCL 4 MG/2ML IJ SOLN
4.0000 mg | Freq: Once | INTRAMUSCULAR | Status: AC
  Administered 2020-01-15: 4 mg via INTRAVENOUS
  Filled 2020-01-15: qty 2

## 2020-01-15 MED ORDER — MORPHINE SULFATE (PF) 4 MG/ML IV SOLN
4.0000 mg | Freq: Once | INTRAVENOUS | Status: AC
  Administered 2020-01-15: 4 mg via INTRAVENOUS
  Filled 2020-01-15: qty 1

## 2020-01-15 MED ORDER — IBUPROFEN 600 MG PO TABS: 600.0000 mg | ORAL_TABLET | Freq: Four times a day (QID) | ORAL | 0 refills | Status: DC | PRN

## 2020-01-15 MED ORDER — SODIUM CHLORIDE 0.9 % IV BOLUS
1000.0000 mL | Freq: Once | INTRAVENOUS | Status: AC
  Administered 2020-01-15: 1000 mL via INTRAVENOUS
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
## 2020-01-15 NOTE — ED Triage Notes (Signed)
Pt reports back pain, worse on L side with dysuria since Monday. Pt also having bilateral ear pain, fevers, congestion, headaches

## 2020-01-15 NOTE — ED Notes (Signed)
Patient states lower left back pain since Monday 10/10 that is worse with sitting. Patient states he has been standing all night because the pain is so bad. Denies nausea, vomiting, or blood in urine.

## 2020-01-15 NOTE — Discharge Instructions (Addendum)
[email or web address]

## 2020-01-16 ENCOUNTER — Telehealth: Payer: Self-pay | Admitting: *Deleted

## 2020-01-16 NOTE — Telephone Encounter (Signed)
Transition Care Management Unsuccessful Follow-up Telephone Call  Date of discharge and from where:  01/15/20, [facility]  Attempts:  1st Attempt  Reason for unsuccessful TCM follow-up call:  Left voice message.  Burnard Bunting, RN, BSN, CCRN Patient Engagement Center [phone]

## 2020-03-05 IMAGING — DX PORTABLE CHEST - 1 VIEW
2 series · 2 of 2 positions shown · non-contrast
Comparison: 07/26/2016

CLINICAL DATA: Left sided chest pain, shortness of breath and
chills onset today.

EXAM:
PORTABLE CHEST 1 VIEW

[chest ap (1 of 2)]
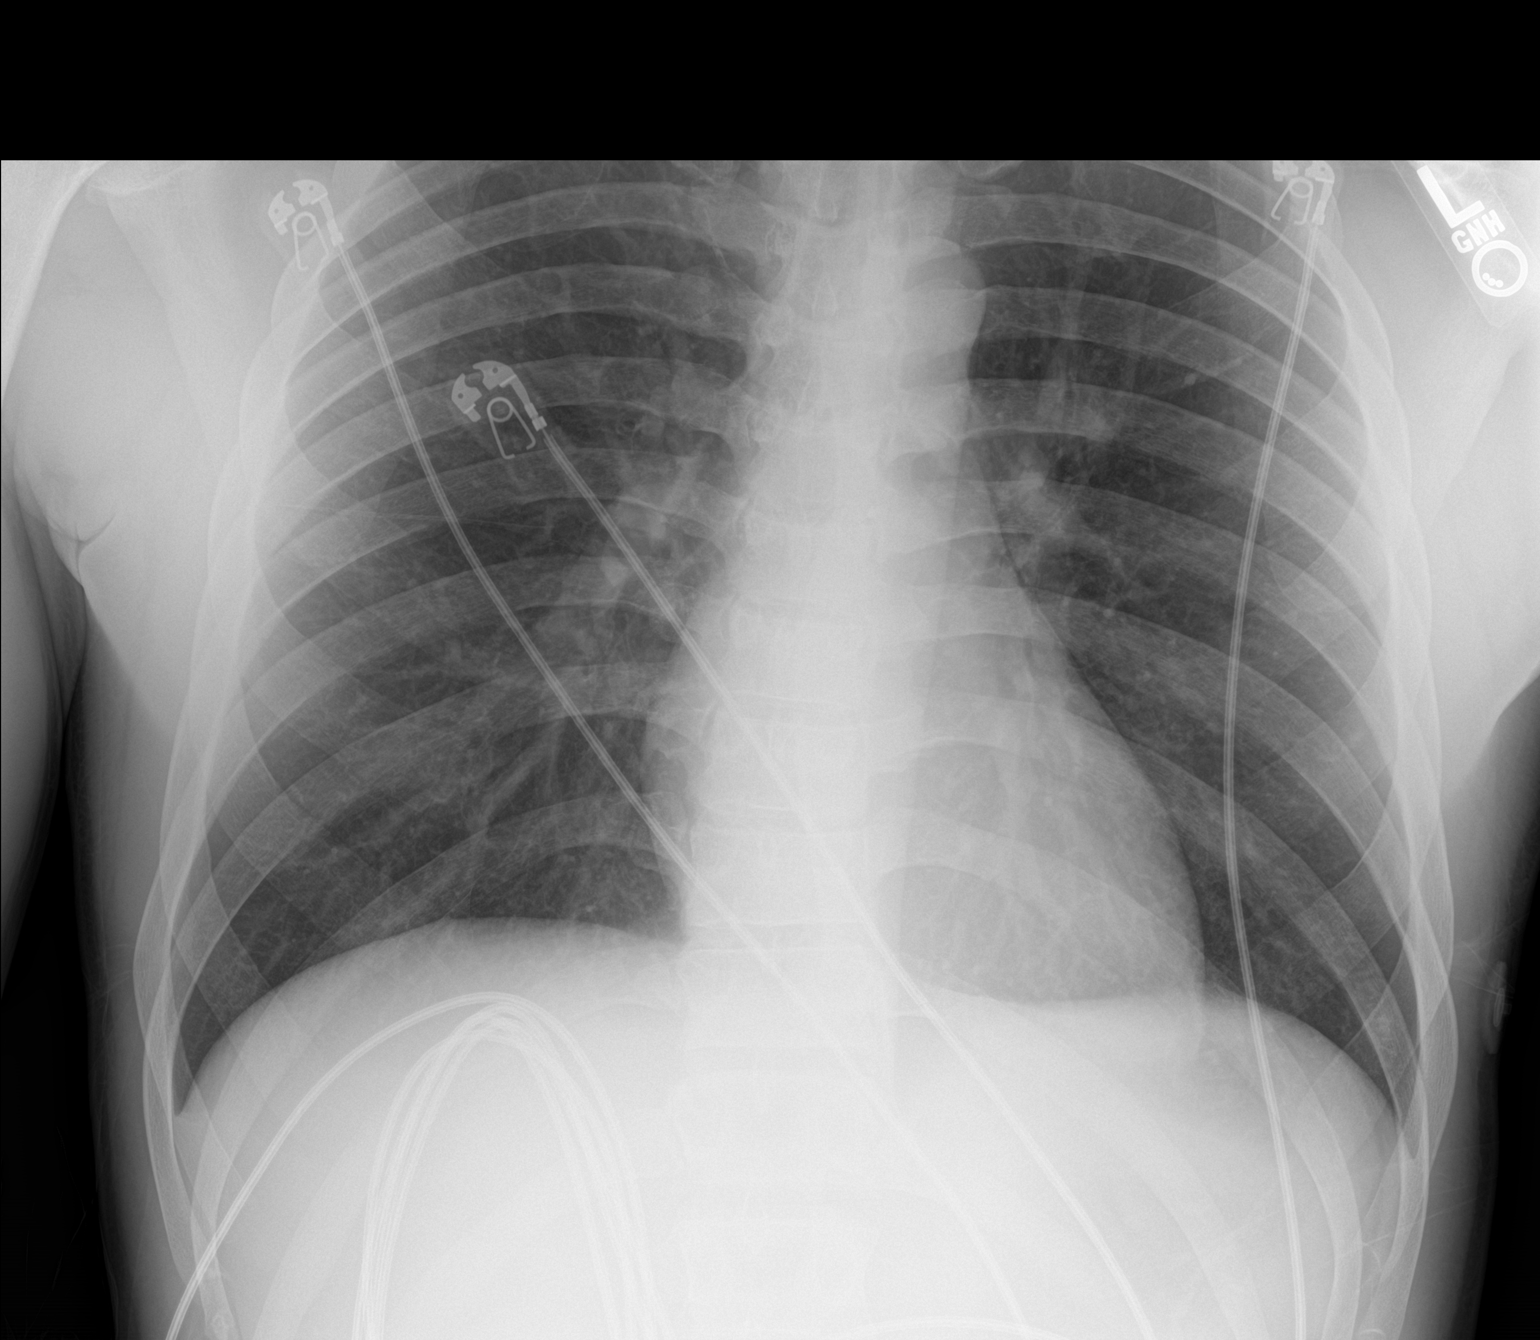

[chest ap (2 of 2)]
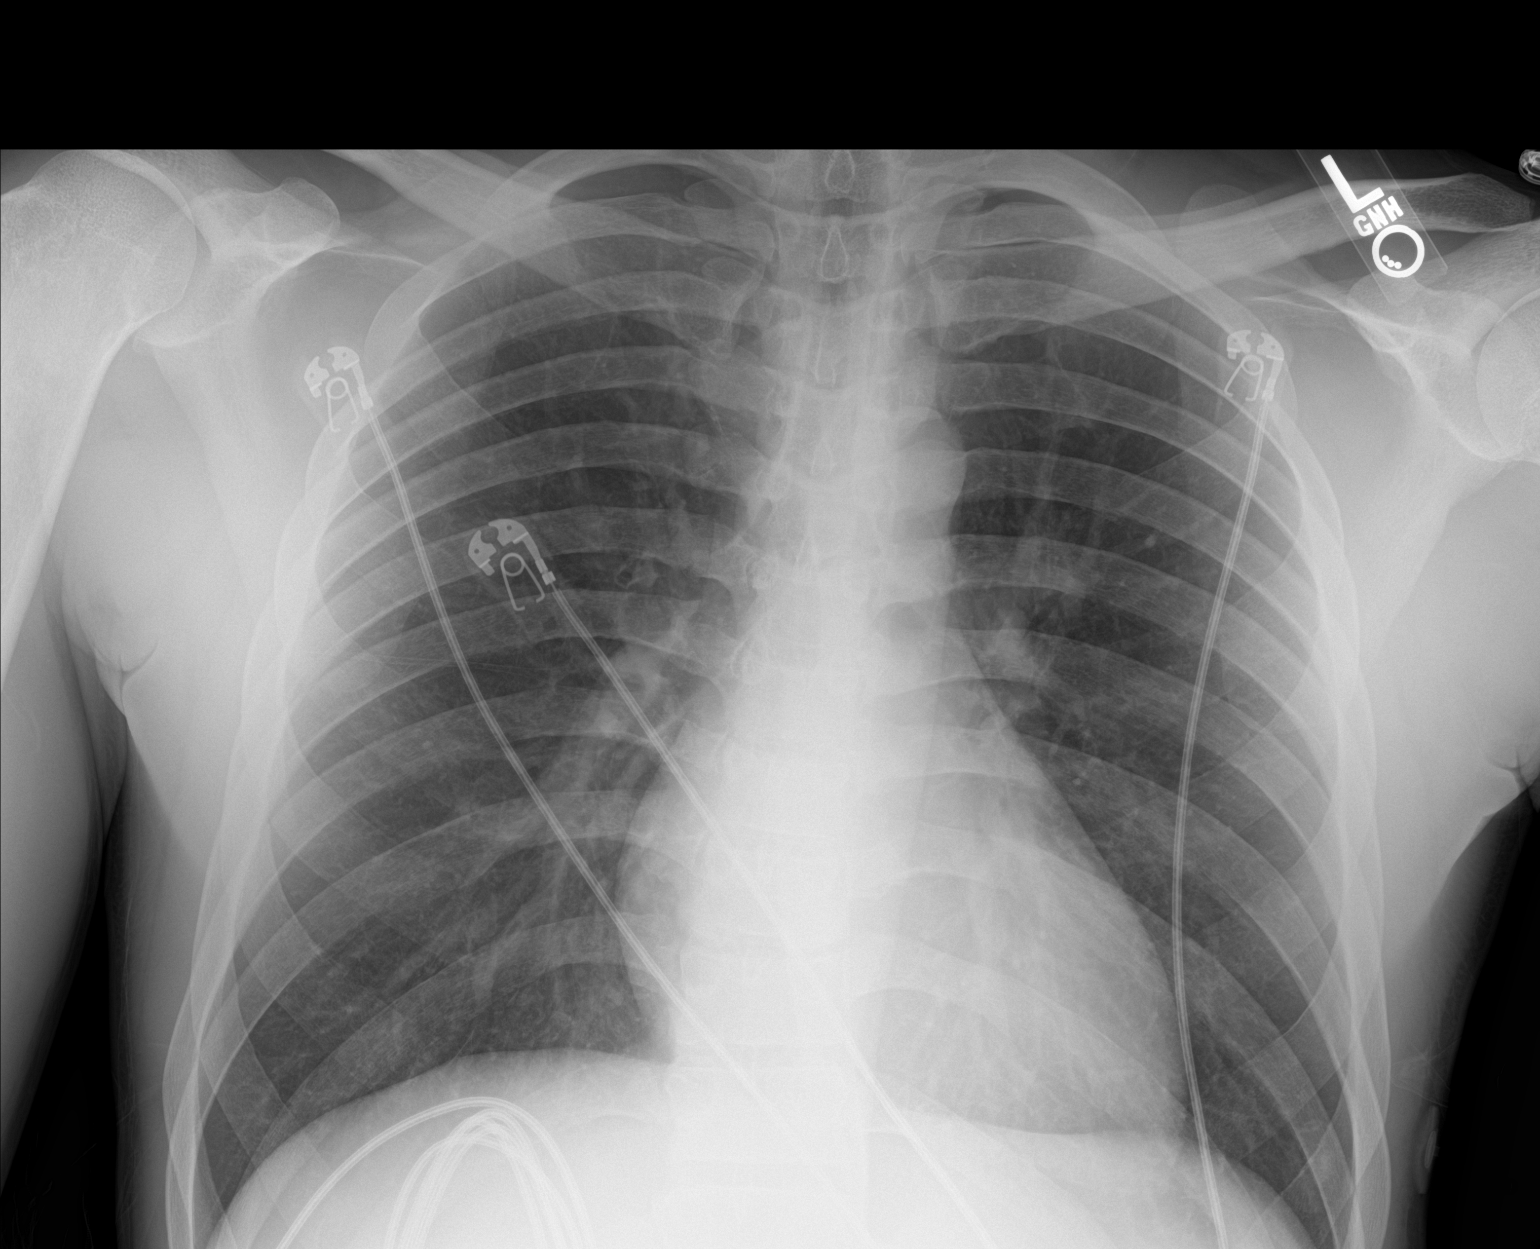

[2 of 2 positions shown; findings below may reference images not displayed]

FINDINGS: The heart size and mediastinal contours are within normal limits.
Both lungs are clear. The visualized skeletal structures are
unremarkable.
IMPRESSION: No active disease.

## 2020-03-14 ENCOUNTER — Other Ambulatory Visit: Payer: Self-pay

## 2020-03-14 ENCOUNTER — Encounter (HOSPITAL_COMMUNITY): Payer: Self-pay

## 2020-03-14 ENCOUNTER — Emergency Department (HOSPITAL_COMMUNITY)
Admission: EM | Admit: 2020-03-14 | Discharge: 2020-03-14 | Disposition: A | Discharge status: Home / Self Care | Payer: Medicaid Other | Attending: Emergency Medicine | Admitting: Emergency Medicine

## 2020-03-14 DIAGNOSIS — L731 Pseudofolliculitis barbae: Secondary | ICD-10-CM | POA: Diagnosis not present

## 2020-03-14 DIAGNOSIS — Z87891 Personal history of nicotine dependence: Secondary | ICD-10-CM | POA: Diagnosis not present

## 2020-03-14 DIAGNOSIS — L02224 Furuncle of groin: Secondary | ICD-10-CM | POA: Diagnosis present

## 2020-03-14 HISTORY — DX: Acute myocardial infarction, unspecified: I21.9

## 2020-03-14 MED ORDER — DOXYCYCLINE HYCLATE 100 MG PO CAPS: 100.0000 mg | ORAL_CAPSULE | Freq: Two times a day (BID) | ORAL | 0 refills | Status: AC

## 2020-03-14 NOTE — ED Triage Notes (Signed)
Patient reports an abscess to the right groin area x 2 days. Patient also has a child with possible covid

## 2020-03-14 NOTE — ED Provider Notes (Signed)
Medical screening examination/treatment/procedure(s) were conducted as a shared visit with non-physician practitioner(s) and myself.  I personally evaluated the patient during the encounter.    Patient developed a small area of swelling and painful nodule on his groin on the right side.  This is been present for 2 days.  He has been applying warm compresses.  Patient is alert and well in appearance.  He has an approximately 2 cm area of firmness with a pointing, very small follicular appearing abscess right on the inguinal crease at the high medial thigh.  Was pressure purulent drainage.  No fluctuance.  Area is small.  There is some local lymphadenopathy.  At this time, this appears to be a very focal abscess without additional induration.  Is able to express purulent drainage.  I do not think I&D at this time will be beneficial.  Patient will continue with warm compresses and antibiotics.  He will need to return for reexamination if there is any increased swelling.   Arby Barrette, MD 03/14/20 1511
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
## 2020-03-14 NOTE — Discharge Instructions (Signed)
Please take doxycycline as prescribed.  Please continue to do warm compresses to the area.  Drink plenty of water.  Follow-up with your primary care doctor.  If you do not have 1 please follow-up with the [facility].

## 2020-03-15 ENCOUNTER — Telehealth: Payer: Self-pay | Admitting: *Deleted

## 2020-03-15 NOTE — Telephone Encounter (Signed)
Transition Care Management Follow-up Telephone Call  Date of discharge and from where: 03/14/20 [name]  How have you been since you were released from the hospital? "I am actually feeling better."  Any questions or concerns? No  Items Reviewed:  Did the pt receive and understand the discharge instructions provided? Yes   Medications obtained and verified? Yes   Other? No   Any new allergies since your discharge? No   Dietary orders reviewed? Yes  Do you have support at home? Yes   Home Care and Equipment/Supplies: Were home health services ordered? not applicable If so, what is the name of the agency? N/A  Has the agency set up a time to come to the patient's home? not applicable Were any new equipment or medical supplies ordered?  No What is the name of the medical supply agency? N/A Were you able to get the supplies/equipment? not applicable Do you have any questions related to the use of the equipment or supplies? No  Functional Questionnaire: (I = Independent and D = Dependent) ADLs: I  Bathing/Dressing- I  Meal Prep- I  Eating- I  Maintaining continence- I  Transferring/Ambulation- I  Managing Meds- I  Follow up appointments reviewed:   PCP Hospital f/u appt confirmed? No  - PEC Team to address  Specialist Hospital f/u appt confirmed? No    Are transportation arrangements needed? No   If their condition worsens, is the pt aware to call PCP or go to the Emergency Dept.? Yes  Was the patient provided with contact information for the PCP's office or ED? Yes  Was to pt encouraged to call back with questions or concerns? Yes

## 2020-03-15 NOTE — Telephone Encounter (Signed)
Called patient to assist with follow up appointment,patient says not at this time. [name] [phone]

## 2020-04-22 ENCOUNTER — Other Ambulatory Visit: Payer: Self-pay

## 2020-04-22 ENCOUNTER — Encounter (HOSPITAL_COMMUNITY): Payer: Self-pay | Admitting: Emergency Medicine

## 2020-04-22 ENCOUNTER — Emergency Department (HOSPITAL_COMMUNITY)
Admission: EM | Admit: 2020-04-22 | Discharge: 2020-04-22 | Disposition: A | Discharge status: Home / Self Care | Payer: Medicaid Other | Attending: Emergency Medicine | Admitting: Emergency Medicine

## 2020-04-22 DIAGNOSIS — L0501 Pilonidal cyst with abscess: Secondary | ICD-10-CM | POA: Insufficient documentation

## 2020-04-22 DIAGNOSIS — R222 Localized swelling, mass and lump, trunk: Secondary | ICD-10-CM | POA: Diagnosis present

## 2020-04-22 DIAGNOSIS — Z87891 Personal history of nicotine dependence: Secondary | ICD-10-CM | POA: Insufficient documentation

## 2020-04-22 MED ORDER — KETOROLAC TROMETHAMINE 60 MG/2ML IM SOLN: 30.0000 mg | Freq: Once | INTRAMUSCULAR | Status: DC

## 2020-04-22 NOTE — Discharge Instructions (Addendum)
Thank you for allowing me to care for you today in the Emergency Department.   Take 650 mg of Tylenol or 600 mg of ibuprofen with food every 6 hours for pain.  You can alternate between these 2 medications every 3 hours if your pain returns.  For instance, you can take Tylenol at noon, followed by a dose of ibuprofen at 3, followed by second dose of Tylenol and 6.  Continue to take sitz bath's 2-3 times a day.  You can also apply a warm washcloth to the area for 15 to 20 minutes up to 3-4 times a day.  You can follow-up with primary care for reevaluation of your symptoms do not significantly improve in the next week.  Return the emergency department if you have significantly worsening swelling of the area, redness, warmth, if you start have red streaking from the area, pain around the rectum, or other new, concerning symptoms.

## 2020-04-22 NOTE — ED Triage Notes (Signed)
Patient is complaining of abscess on right upper inner butt cheek. Patient states this started yesterday.
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
## 2020-04-22 NOTE — ED Notes (Signed)
Patient does not like needles.

## 2020-04-23 ENCOUNTER — Telehealth: Payer: Self-pay

## 2020-04-23 NOTE — Telephone Encounter (Signed)
Transition Care Management Follow-up Telephone Call  Date of discharge and from where: 04/22/2020 [facility]  How have you been since you were released from the hospital? Doing well.   Any questions or concerns? No  Items Reviewed:  Did the pt receive and understand the discharge instructions provided? Yes   Medications obtained and verified? No medications were given.   Other? No   Any new allergies since your discharge? No   Dietary orders reviewed? Yes  Do you have support at home? Yes    Functional Questionnaire: (I = Independent and D = Dependent) ADLs: I  Bathing/Dressing- I  Meal Prep- I  Eating- I  Maintaining continence- I  Transferring/Ambulation- I  Managing Meds- I  Follow up appointments reviewed:   PCP Hospital f/u appt confirmed? Yes  Scheduled to see [facility] on [address] on 04/28/20 @ 4pm.  Specialist Hospital f/u appt confirmed? No    Are transportation arrangements needed? No   If their condition worsens, is the pt aware to call PCP or go to the Emergency Dept.? Yes  Was the patient provided with contact information for the PCP's office or ED? Yes  Was to pt encouraged to call back with questions or concerns? Yes

## 2020-06-14 ENCOUNTER — Other Ambulatory Visit: Payer: Self-pay

## 2020-06-14 ENCOUNTER — Encounter (HOSPITAL_COMMUNITY): Payer: Self-pay

## 2020-06-14 ENCOUNTER — Emergency Department (HOSPITAL_COMMUNITY)
Admission: EM | Admit: 2020-06-14 | Discharge: 2020-06-14 | Disposition: A | Discharge status: Home / Self Care | Payer: Medicaid Other | Attending: Emergency Medicine | Admitting: Emergency Medicine

## 2020-06-14 DIAGNOSIS — Z87891 Personal history of nicotine dependence: Secondary | ICD-10-CM | POA: Diagnosis not present

## 2020-06-14 DIAGNOSIS — L0231 Cutaneous abscess of buttock: Secondary | ICD-10-CM | POA: Diagnosis not present

## 2020-06-14 MED ORDER — LIDOCAINE-EPINEPHRINE (PF) 2 %-1:200000 IJ SOLN
INTRAMUSCULAR | Status: AC
  Filled 2020-06-14: qty 20

## 2020-06-14 MED ORDER — LIDOCAINE-EPINEPHRINE 2 %-1:100000 IJ SOLN: 20.0000 mL | Freq: Once | INTRAMUSCULAR | Status: DC

## 2020-06-14 MED ORDER — LIDOCAINE-EPINEPHRINE (PF) 2 %-1:200000 IJ SOLN: 20.0000 mL | Freq: Once | INTRAMUSCULAR | Status: DC

## 2020-06-14 MED ORDER — DOXYCYCLINE HYCLATE 100 MG PO CAPS: 100.0000 mg | ORAL_CAPSULE | Freq: Two times a day (BID) | ORAL | 0 refills | Status: DC

## 2020-06-14 MED ORDER — IBUPROFEN 600 MG PO TABS: 600.0000 mg | ORAL_TABLET | Freq: Four times a day (QID) | ORAL | 0 refills | Status: DC | PRN
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
## 2020-06-14 NOTE — ED Triage Notes (Signed)
Patient arrived stating he has a reoccurring abscess on his rectum. Reports warm bath for comfort

## 2020-06-14 NOTE — Discharge Instructions (Signed)
I have performed an incision and drainage to the affected area.  It appears you have a large amount of scar tissue to your gluteal cleft from prior infection.  Your pain may be signs of early abscess.  Please take antibiotic as prescribed.  Call and follow-up closely with general surgery for further evaluation and management.  Return if your symptoms worsen or if you have any other concern.  Continue to apply warm moist compress to affected area several times daily.  Be sure to keep a dressing over the wound for the next 7 days.  Change dressing daily.

## 2020-09-04 ENCOUNTER — Emergency Department (HOSPITAL_COMMUNITY)
Admission: EM | Admit: 2020-09-04 | Discharge: 2020-09-04 | Disposition: A | Discharge status: Home / Self Care | Payer: Medicaid Other | Attending: Emergency Medicine | Admitting: Emergency Medicine

## 2020-09-04 ENCOUNTER — Encounter (HOSPITAL_COMMUNITY): Payer: Self-pay

## 2020-09-04 DIAGNOSIS — T310 Burns involving less than 10% of body surface: Secondary | ICD-10-CM | POA: Diagnosis not present

## 2020-09-04 DIAGNOSIS — Z23 Encounter for immunization: Secondary | ICD-10-CM | POA: Insufficient documentation

## 2020-09-04 DIAGNOSIS — L0291 Cutaneous abscess, unspecified: Secondary | ICD-10-CM

## 2020-09-04 DIAGNOSIS — L02419 Cutaneous abscess of limb, unspecified: Secondary | ICD-10-CM | POA: Insufficient documentation

## 2020-09-04 DIAGNOSIS — X102XXA Contact with fats and cooking oils, initial encounter: Secondary | ICD-10-CM | POA: Diagnosis not present

## 2020-09-04 DIAGNOSIS — S6991XA Unspecified injury of right wrist, hand and finger(s), initial encounter: Secondary | ICD-10-CM | POA: Insufficient documentation

## 2020-09-04 DIAGNOSIS — T23069A Burn of unspecified degree of back of unspecified hand, initial encounter: Secondary | ICD-10-CM

## 2020-09-04 DIAGNOSIS — Z87891 Personal history of nicotine dependence: Secondary | ICD-10-CM | POA: Diagnosis not present

## 2020-09-04 DIAGNOSIS — T23031A Burn of unspecified degree of multiple right fingers (nail), not including thumb, initial encounter: Secondary | ICD-10-CM | POA: Insufficient documentation

## 2020-09-04 LAB — CBC WITH DIFFERENTIAL/PLATELET
Abs Immature Granulocytes: 0.03 10*3/uL (ref 0.00–0.07)
Basophils Absolute: 0.1 10*3/uL (ref 0.0–0.1)
Basophils Relative: 1 %
Eosinophils Absolute: 0.2 10*3/uL (ref 0.0–0.5)
Eosinophils Relative: 2 %
HCT: 45.3 % (ref 39.0–52.0)
Hemoglobin: 15.3 g/dL (ref 13.0–17.0)
Immature Granulocytes: 0 %
Lymphocytes Relative: 14 %
Lymphs Abs: 1.4 10*3/uL (ref 0.7–4.0)
MCH: 31.8 pg (ref 26.0–34.0)
MCHC: 33.8 g/dL (ref 30.0–36.0)
MCV: 94.2 fL (ref 80.0–100.0)
Monocytes Absolute: 0.7 10*3/uL (ref 0.1–1.0)
Monocytes Relative: 7 %
Neutro Abs: 7.5 10*3/uL (ref 1.7–7.7)
Neutrophils Relative %: 76 %
Platelets: 252 10*3/uL (ref 150–400)
RBC: 4.81 MIL/uL (ref 4.22–5.81)
RDW: 12.6 % (ref 11.5–15.5)
WBC: 9.8 10*3/uL (ref 4.0–10.5)
nRBC: 0 % (ref 0.0–0.2)

## 2020-09-04 LAB — BASIC METABOLIC PANEL
Anion gap: 7 (ref 5–15)
BUN: 12 mg/dL (ref 6–20)
CO2: 26 mmol/L (ref 22–32)
Calcium: 9.4 mg/dL (ref 8.9–10.3)
Chloride: 104 mmol/L (ref 98–111)
Creatinine, Ser: 0.98 mg/dL (ref 0.61–1.24)
GFR, Estimated: >60 mL/min (ref >60)
Glucose, Bld: 95 mg/dL (ref 70–99)
Potassium: 4 mmol/L (ref 3.5–5.1)
Sodium: 137 mmol/L (ref 135–145)

## 2020-09-04 MED ORDER — CEPHALEXIN 500 MG PO CAPS: 500.0000 mg | ORAL_CAPSULE | Freq: Four times a day (QID) | ORAL | 0 refills | Status: DC

## 2020-09-04 MED ORDER — LACTATED RINGERS IV BOLUS
1000.0000 mL | Freq: Once | INTRAVENOUS | Status: AC
  Administered 2020-09-04: 1000 mL via INTRAVENOUS

## 2020-09-04 MED ORDER — OXYCODONE-ACETAMINOPHEN 5-325 MG PO TABS: 1.0000 | ORAL_TABLET | ORAL | 0 refills | Status: DC | PRN

## 2020-09-04 MED ORDER — HYDROMORPHONE HCL 1 MG/ML IJ SOLN
1.0000 mg | Freq: Once | INTRAMUSCULAR | Status: AC
  Administered 2020-09-04: 1 mg via INTRAVENOUS
  Filled 2020-09-04: qty 1

## 2020-09-04 MED ORDER — TETANUS-DIPHTH-ACELL PERTUSSIS 5-2.5-18.5 LF-MCG/0.5 IM SUSY
0.5000 mL | PREFILLED_SYRINGE | Freq: Once | INTRAMUSCULAR | Status: AC
  Administered 2020-09-04: 0.5 mL via INTRAMUSCULAR
  Filled 2020-09-04: qty 0.5

## 2020-09-04 MED ORDER — CEPHALEXIN 500 MG PO CAPS
500.0000 mg | ORAL_CAPSULE | Freq: Once | ORAL | Status: AC
  Administered 2020-09-04: 500 mg via ORAL
  Filled 2020-09-04: qty 1

## 2020-09-04 MED ORDER — ONDANSETRON HCL 4 MG/2ML IJ SOLN
4.0000 mg | Freq: Once | INTRAMUSCULAR | Status: AC
  Administered 2020-09-04: 4 mg via INTRAVENOUS
  Filled 2020-09-04: qty 2

## 2020-09-04 MED ORDER — SILVER SULFADIAZINE 1 % EX CREA
TOPICAL_CREAM | Freq: Once | CUTANEOUS | Status: AC
  Filled 2020-09-04: qty 50
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
## 2020-09-04 NOTE — ED Notes (Addendum)
Silvadene applies to the the right finger and upper arm and dressing applied. Left forearm wounds clean and dressing applied.

## 2020-09-06 ENCOUNTER — Telehealth: Payer: Self-pay | Admitting: *Deleted

## 2020-09-06 NOTE — Telephone Encounter (Signed)
Transition Care Management Unsuccessful Follow-up Telephone Call  Date of discharge and from where:  09/04/2020 [name]  Attempts:  1st Attempt  Reason for unsuccessful TCM follow-up call:  Voice mail full

## 2020-09-07 NOTE — Telephone Encounter (Signed)
Transition Care Management Unsuccessful Follow-up Telephone Call  Date of discharge and from where:  09/04/2020 - [facility]  Attempts:  2nd Attempt  Reason for unsuccessful TCM follow-up call:  Voice mail full

## 2020-09-08 NOTE — Telephone Encounter (Signed)
Transition Care Management Unsuccessful Follow-up Telephone Call  Date of discharge and from where:  09/04/2020 [name]  Attempts:  3rd Attempt  Reason for unsuccessful TCM follow-up call:  Voice mail full

## 2020-09-28 IMAGING — CR DG CHEST 2V
2 series · 2 of 2 positions shown · non-contrast
Comparison: August 16, 2018

CLINICAL DATA: Chest pain

EXAM:
CHEST - 2 VIEW

[w chest pa]
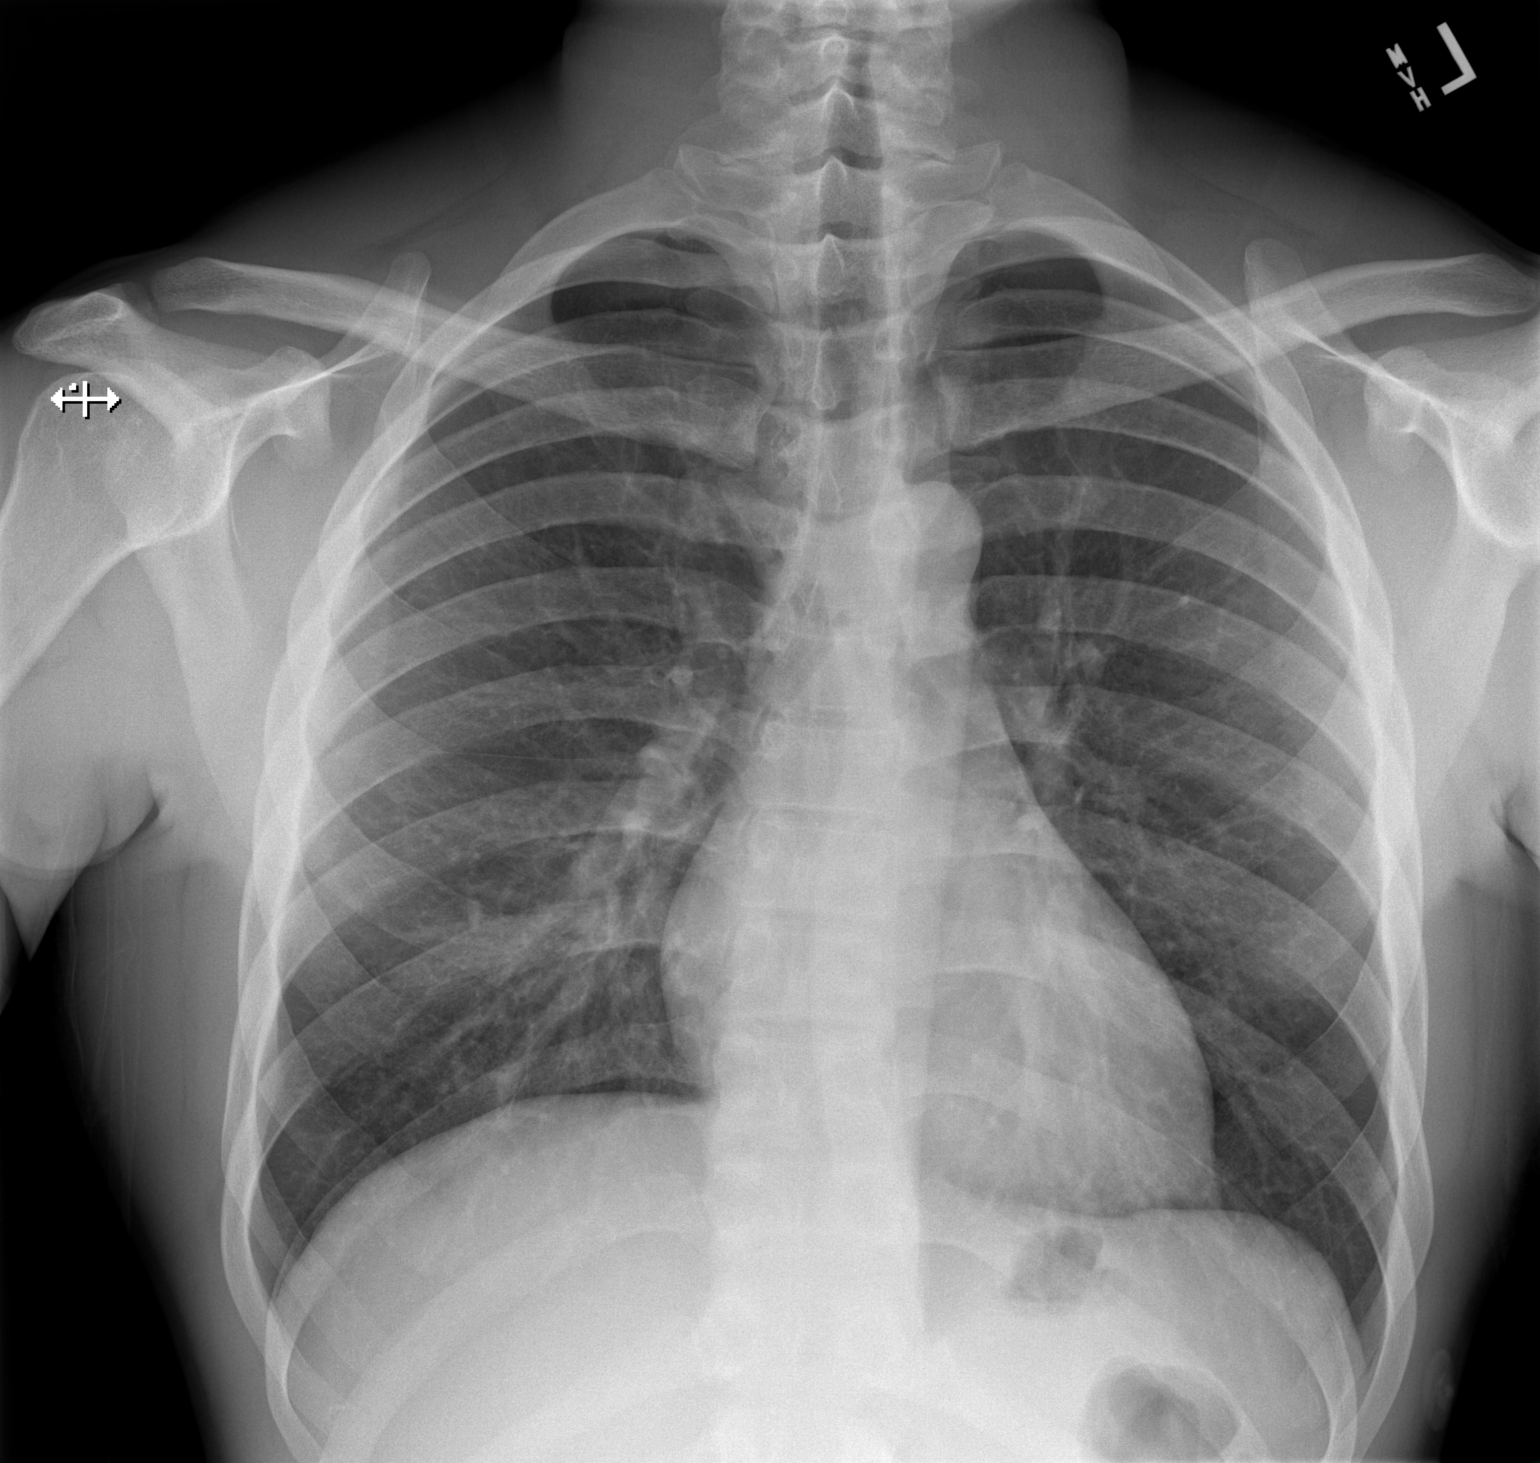

[w chest lat]
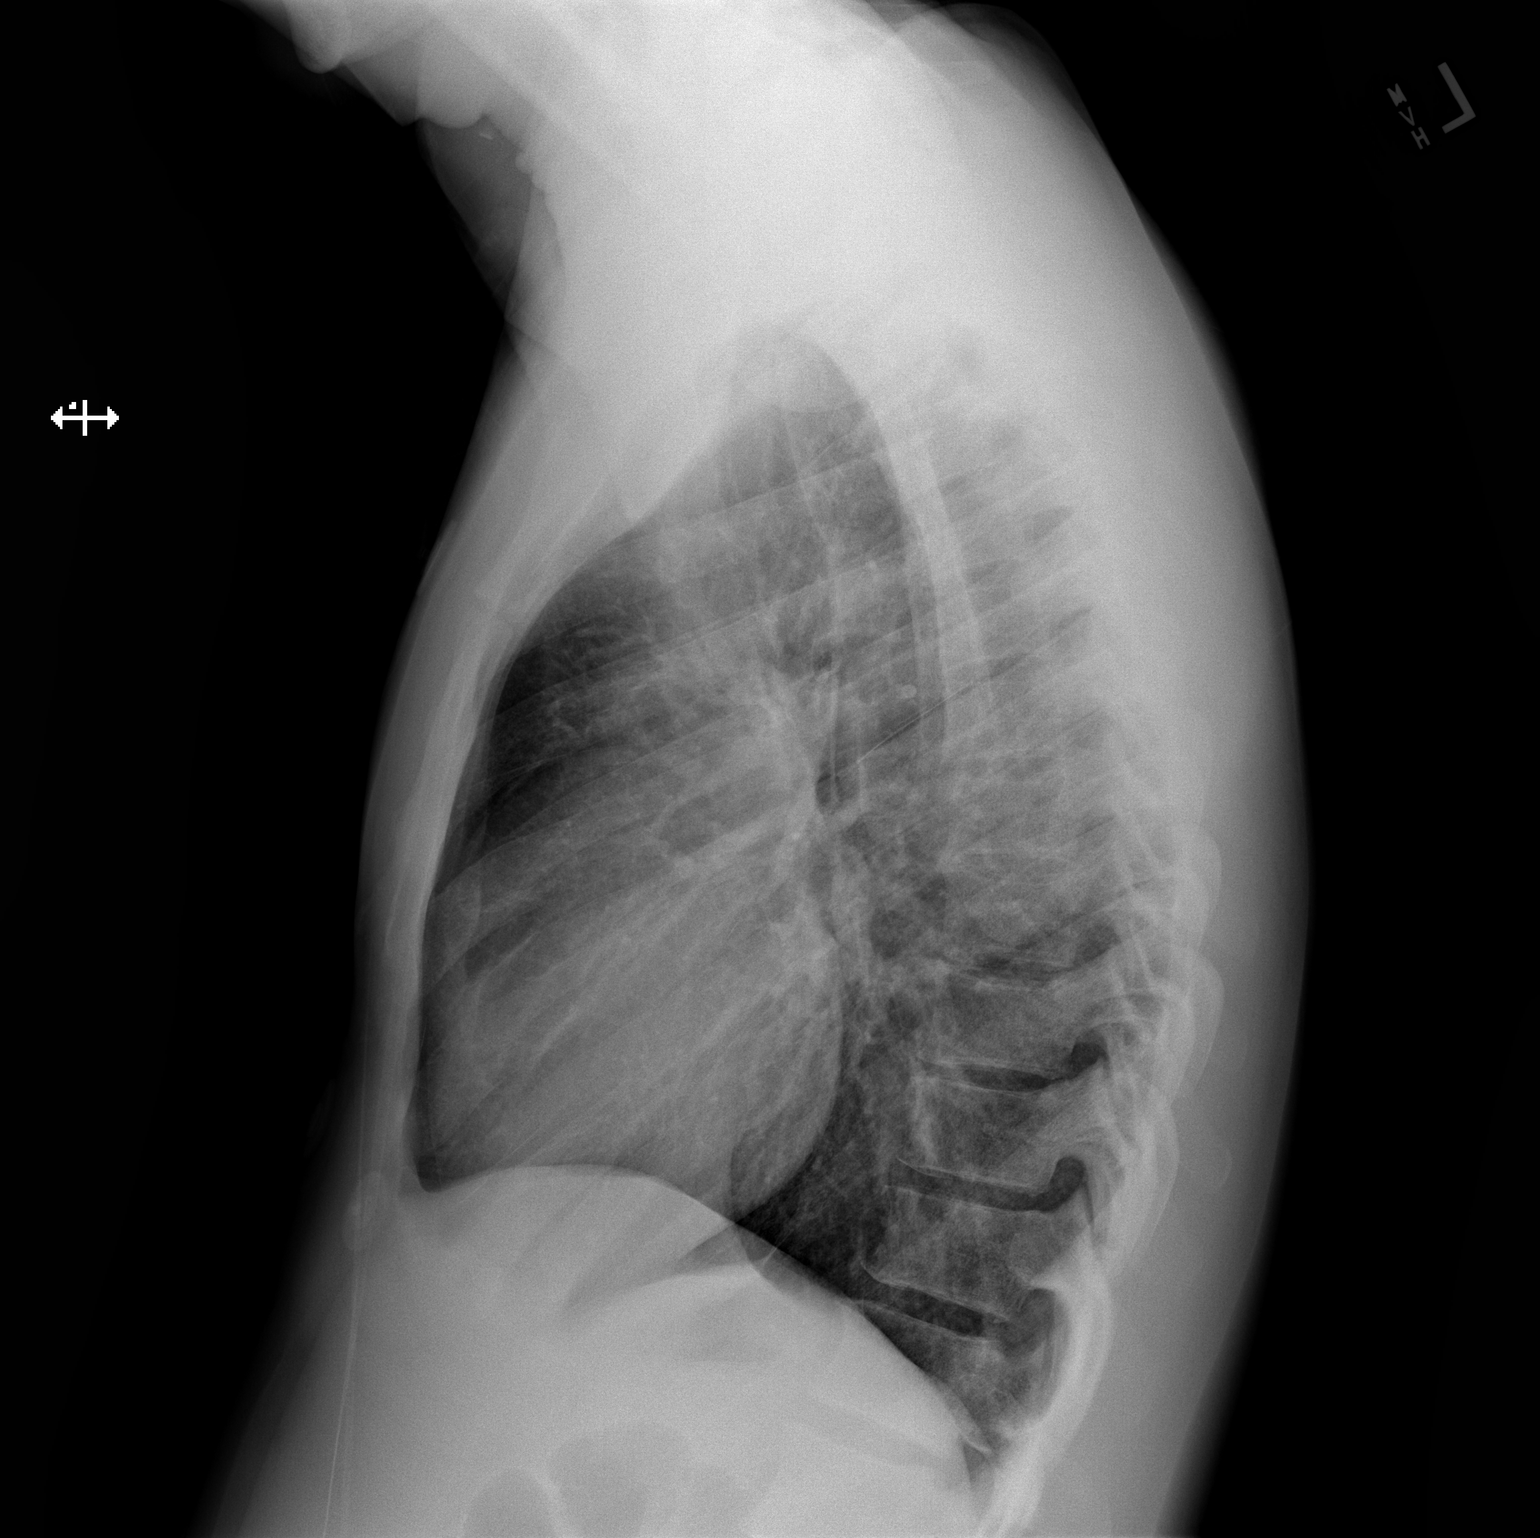

[2 of 2 positions shown; findings below may reference images not displayed]

FINDINGS: Lungs are clear. Heart size and pulmonary vascularity are normal. No
adenopathy. No pneumothorax. No bone lesions.
IMPRESSION: No edema or consolidation.

## 2020-11-09 ENCOUNTER — Emergency Department (HOSPITAL_COMMUNITY)
Admission: EM | Admit: 2020-11-09 | Discharge: 2020-11-09 | Disposition: A | Discharge status: Home / Self Care | Payer: Medicaid Other | Attending: Emergency Medicine | Admitting: Emergency Medicine

## 2020-11-09 ENCOUNTER — Emergency Department (HOSPITAL_COMMUNITY): Payer: Medicaid Other

## 2020-11-09 ENCOUNTER — Other Ambulatory Visit: Payer: Self-pay

## 2020-11-09 ENCOUNTER — Encounter (HOSPITAL_COMMUNITY): Payer: Self-pay

## 2020-11-09 DIAGNOSIS — K610 Anal abscess: Secondary | ICD-10-CM | POA: Diagnosis not present

## 2020-11-09 DIAGNOSIS — R072 Precordial pain: Secondary | ICD-10-CM | POA: Insufficient documentation

## 2020-11-09 DIAGNOSIS — R0981 Nasal congestion: Secondary | ICD-10-CM | POA: Diagnosis not present

## 2020-11-09 DIAGNOSIS — Z87891 Personal history of nicotine dependence: Secondary | ICD-10-CM | POA: Diagnosis not present

## 2020-11-09 DIAGNOSIS — K611 Rectal abscess: Secondary | ICD-10-CM | POA: Insufficient documentation

## 2020-11-09 DIAGNOSIS — L02415 Cutaneous abscess of right lower limb: Secondary | ICD-10-CM | POA: Diagnosis not present

## 2020-11-09 DIAGNOSIS — R079 Chest pain, unspecified: Secondary | ICD-10-CM | POA: Diagnosis not present

## 2020-11-09 LAB — CBC WITH DIFFERENTIAL/PLATELET
Abs Immature Granulocytes: 0.03 10*3/uL (ref 0.00–0.07)
Basophils Absolute: 0 10*3/uL (ref 0.0–0.1)
Basophils Relative: 0 %
Eosinophils Absolute: 0 10*3/uL (ref 0.0–0.5)
Eosinophils Relative: 0 %
HCT: 40.1 % (ref 39.0–52.0)
Hemoglobin: 13.3 g/dL (ref 13.0–17.0)
Immature Granulocytes: 0 %
Lymphocytes Relative: 11 %
Lymphs Abs: 1.4 10*3/uL (ref 0.7–4.0)
MCH: 31.5 pg (ref 26.0–34.0)
MCHC: 33.2 g/dL (ref 30.0–36.0)
MCV: 95 fL (ref 80.0–100.0)
Monocytes Absolute: 1.2 10*3/uL — ABNORMAL HIGH (ref 0.1–1.0)
Monocytes Relative: 10 %
Neutro Abs: 9.8 10*3/uL — ABNORMAL HIGH (ref 1.7–7.7)
Neutrophils Relative %: 79 %
Platelets: 217 10*3/uL (ref 150–400)
RBC: 4.22 MIL/uL (ref 4.22–5.81)
RDW: 12.8 % (ref 11.5–15.5)
WBC: 12.5 10*3/uL — ABNORMAL HIGH (ref 4.0–10.5)
nRBC: 0 % (ref 0.0–0.2)

## 2020-11-09 LAB — TROPONIN I (HIGH SENSITIVITY): Troponin I (High Sensitivity): 3 ng/L (ref <18)

## 2020-11-09 LAB — BASIC METABOLIC PANEL
Anion gap: 5 (ref 5–15)
BUN: 8 mg/dL (ref 6–20)
CO2: 25 mmol/L (ref 22–32)
Calcium: 9.1 mg/dL (ref 8.9–10.3)
Chloride: 107 mmol/L (ref 98–111)
Creatinine, Ser: 0.88 mg/dL (ref 0.61–1.24)
GFR, Estimated: >60 mL/min (ref >60)
Glucose, Bld: 101 mg/dL — ABNORMAL HIGH (ref 70–99)
Potassium: 3.5 mmol/L (ref 3.5–5.1)
Sodium: 137 mmol/L (ref 135–145)

## 2020-11-09 MED ORDER — LIDOCAINE-EPINEPHRINE (PF) 2 %-1:200000 IJ SOLN
20.0000 mL | Freq: Once | INTRAMUSCULAR | Status: AC
  Administered 2020-11-09: 20 mL
  Filled 2020-11-09: qty 20

## 2020-11-09 MED ORDER — OXYCODONE HCL 5 MG PO TABS
5.0000 mg | ORAL_TABLET | Freq: Once | ORAL | Status: AC
  Administered 2020-11-09: 07:00:00 5 mg via ORAL
  Filled 2020-11-09: qty 1

## 2020-11-09 MED ORDER — SULFAMETHOXAZOLE-TRIMETHOPRIM 800-160 MG PO TABS
1.0000 | ORAL_TABLET | Freq: Once | ORAL | Status: AC
  Administered 2020-11-09: 07:00:00 1 via ORAL
  Filled 2020-11-09: qty 1

## 2020-11-09 MED ORDER — SULFAMETHOXAZOLE-TRIMETHOPRIM 800-160 MG PO TABS: 1.0000 | ORAL_TABLET | Freq: Two times a day (BID) | ORAL | 0 refills | Status: AC

## 2020-11-09 MED ORDER — OXYCODONE-ACETAMINOPHEN 5-325 MG PO TABS: 1.0000 | ORAL_TABLET | Freq: Four times a day (QID) | ORAL | 0 refills | Status: DC | PRN

## 2020-11-09 NOTE — ED Notes (Signed)
Verbalized understanding discharge instructions, prescriptions, and follow-up. In no acute distress.   

## 2020-11-09 NOTE — ED Triage Notes (Signed)
Pt states that he had an abscess drained a couple of months ago that has returned. Location right buttock. Pt complains of chest pains, sharp with exertion. Pt complains of congestion and runny nose for 1 week.

## 2020-11-09 NOTE — Discharge Instructions (Signed)
Please read and follow all provided instructions.  Your diagnoses today include:  1. Perirectal abscess   2. Precordial pain     Tests performed today include: Vital signs. See below for your results today.  EKG/cardiac enzymes/Chest x-ray - no concerning findings in regards to heart and lungs Blood cell counts (white, red, and platelets) - slightly high white blood cell count Electrolytes  Kidney function test  Medications prescribed:  Bactrim (trimethoprim/sulfamethoxazole) - antibiotic  You have been prescribed an antibiotic medicine: take the entire course of medicine even if you are feeling better. Stopping early can cause the antibiotic not to work.  Percocet (oxycodone/acetaminophen) - narcotic pain medication  DO NOT drive or perform any activities that require you to be awake and alert because this medicine can make you drowsy. BE VERY CAREFUL not to take multiple medicines containing Tylenol (also called acetaminophen). Doing so can lead to an overdose which can damage your liver and cause liver failure and possibly death.  Take any prescribed medications only as directed.   Home care instructions:  Follow any educational materials contained in this packet  Follow-up instructions: Return to the Emergency Department in 48 hours for a recheck if your symptoms are not significantly improved.  Please follow-up with your primary care provider in the next 1 week for further evaluation of your symptoms.   Return instructions:  Return to the Emergency Department if you have: Fever Worsening symptoms Worsening pain Worsening swelling Redness of the skin that moves away from the affected area, especially if it streaks away from the affected area  Any other emergent concerns  Your vital signs today were: BP 108/61   Pulse 64   Temp 99.5 F (37.5 C) (Oral)   Resp 18   SpO2 97%  If your blood pressure (BP) was elevated above 135/85 this visit, please have this repeated  by your doctor within one month. --------------

## 2020-11-09 NOTE — ED Notes (Signed)
Pt refused EKG and refused to have the leads put on him. Due to pain RN has been notified.

## 2020-11-09 NOTE — ED Notes (Signed)
Pt refused vital signs.

## 2020-11-09 NOTE — ED Notes (Signed)
Medication and I&D tray at bedside.
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
## 2020-11-10 ENCOUNTER — Telehealth: Payer: Self-pay | Admitting: *Deleted

## 2020-11-10 NOTE — Telephone Encounter (Signed)
Transition Care Management Follow-up Telephone Call Date of discharge and from where: 11/09/2020 - [facility] How have you been since you were released from the hospital? "I am fine" Any questions or concerns? No  Items Reviewed: Did the pt receive and understand the discharge instructions provided? Yes  Medications obtained and verified? Yes  Other? No  Any new allergies since your discharge? No  Dietary orders reviewed? No Do you have support at home? Yes    Functional Questionnaire: (I = Independent and D = Dependent) ADLs: I  Bathing/Dressing- I  Meal Prep- I  Eating- I  Maintaining continence- I  Transferring/Ambulation- I  Managing Meds- I  Follow up appointments reviewed:  PCP Hospital f/u appt confirmed? No   Specialist Hospital f/u appt confirmed? No   Are transportation arrangements needed? No  If their condition worsens, is the pt aware to call PCP or go to the Emergency Dept.? Yes Was the patient provided with contact information for the PCP's office or ED? Yes Was to pt encouraged to call back with questions or concerns? Yes

## 2020-12-07 ENCOUNTER — Emergency Department (HOSPITAL_COMMUNITY)
Admission: EM | Admit: 2020-12-07 | Discharge: 2020-12-07 | Disposition: A | Discharge status: Home / Self Care | Payer: Medicaid Other

## 2020-12-07 NOTE — ED Notes (Signed)
Pt called for triage, no answer

## 2020-12-07 NOTE — ED Notes (Signed)
Pt advised staff that he needed to find son, left triage room

## 2020-12-08 ENCOUNTER — Encounter (HOSPITAL_COMMUNITY): Payer: Self-pay | Admitting: *Deleted

## 2020-12-08 ENCOUNTER — Emergency Department (HOSPITAL_COMMUNITY)
Admission: EM | Admit: 2020-12-08 | Discharge: 2020-12-08 | Disposition: A | Discharge status: Home / Self Care | Payer: Medicaid Other | Attending: Student | Admitting: Student

## 2020-12-08 DIAGNOSIS — Z5321 Procedure and treatment not carried out due to patient leaving prior to being seen by health care provider: Secondary | ICD-10-CM | POA: Insufficient documentation

## 2020-12-08 DIAGNOSIS — Z202 Contact with and (suspected) exposure to infections with a predominantly sexual mode of transmission: Secondary | ICD-10-CM | POA: Diagnosis present

## 2020-12-08 NOTE — ED Triage Notes (Signed)
Pt's sexual partner called him, reported that she tested positive for trichomoniasis yesterday. He last had unprotected sex with her 2 days days ago

## 2020-12-09 LAB — GC/CHLAMYDIA PROBE AMP (~~LOC~~) NOT AT ARMC
Chlamydia: NEGATIVE
Comment: NEGATIVE
Comment: NORMAL
Neisseria Gonorrhea: NEGATIVE

## 2020-12-10 ENCOUNTER — Other Ambulatory Visit: Payer: Self-pay

## 2020-12-10 ENCOUNTER — Emergency Department (HOSPITAL_COMMUNITY)
Admission: EM | Admit: 2020-12-10 | Discharge: 2020-12-10 | Disposition: A | Discharge status: Home / Self Care | Payer: Medicaid Other | Attending: Emergency Medicine | Admitting: Emergency Medicine

## 2020-12-10 DIAGNOSIS — Z202 Contact with and (suspected) exposure to infections with a predominantly sexual mode of transmission: Secondary | ICD-10-CM | POA: Diagnosis not present

## 2020-12-10 DIAGNOSIS — Z87891 Personal history of nicotine dependence: Secondary | ICD-10-CM | POA: Insufficient documentation

## 2020-12-10 MED ORDER — METRONIDAZOLE 500 MG PO TABS
2000.0000 mg | ORAL_TABLET | Freq: Once | ORAL | Status: AC
  Administered 2020-12-10: 2000 mg via ORAL
  Filled 2020-12-10: qty 4

## 2020-12-10 NOTE — Discharge Instructions (Signed)
Refrain from intercourse for at least 10-14 days to make sure medication takes full effect.
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
## 2020-12-10 NOTE — ED Triage Notes (Signed)
Pt. Is A&Ox4 ambulatory. Patient got a text from his partner saying he might have been exposed to a STD. He is upset " this aint right".

## 2020-12-13 ENCOUNTER — Telehealth: Payer: Self-pay

## 2020-12-13 NOTE — Telephone Encounter (Signed)
Transition Care Management Follow-up Telephone Call Date of discharge and from where: 12/10/2020-[facility]  How have you been since you were released from the hospital? Patient stated he is doing fine.  Any questions or concerns? Yes  Items Reviewed: Did the pt receive and understand the discharge instructions provided? Yes  Medications obtained and verified? Yes  Other? No  Any new allergies since your discharge? No  Dietary orders reviewed? N/A Do you have support at home? Yes   Home Care and Equipment/Supplies: Were home health services ordered? not applicable If so, what is the name of the agency? N/A  Has the agency set up a time to come to the patient's home? not applicable Were any new equipment or medical supplies ordered?  No What is the name of the medical supply agency? N /A Were you able to get the supplies/equipment? not applicable Do you have any questions related to the use of the equipment or supplies? No  Functional Questionnaire: (I = Independent and D = Dependent) ADLs: I  Bathing/Dressing- I  Meal Prep- I  Eating- I  Maintaining continence- I  Transferring/Ambulation- I  Managing Meds- I  Follow up appointments reviewed:  PCP Hospital f/u appt confirmed? No   Specialist Hospital f/u appt confirmed? No   Are transportation arrangements needed? No  If their condition worsens, is the pt aware to call PCP or go to the Emergency Dept.? Yes Was the patient provided with contact information for the PCP's office or ED? Yes Was to pt encouraged to call back with questions or concerns? Yes

## 2021-04-28 ENCOUNTER — Encounter (HOSPITAL_COMMUNITY): Payer: Self-pay

## 2021-04-28 ENCOUNTER — Emergency Department (HOSPITAL_COMMUNITY)
Admission: EM | Admit: 2021-04-28 | Discharge: 2021-04-29 | Disposition: A | Discharge status: Home / Self Care | Payer: Medicaid Other | Attending: Emergency Medicine | Admitting: Emergency Medicine

## 2021-04-28 DIAGNOSIS — L0231 Cutaneous abscess of buttock: Secondary | ICD-10-CM

## 2021-04-28 DIAGNOSIS — Z23 Encounter for immunization: Secondary | ICD-10-CM | POA: Diagnosis not present

## 2021-04-28 DIAGNOSIS — Z87891 Personal history of nicotine dependence: Secondary | ICD-10-CM | POA: Insufficient documentation

## 2021-04-28 MED ORDER — LIDOCAINE-EPINEPHRINE 2 %-1:100000 IJ SOLN
20.0000 mL | Freq: Once | INTRAMUSCULAR | Status: AC
  Administered 2021-04-28: 20 mL
  Filled 2021-04-28: qty 1

## 2021-04-28 MED ORDER — IBUPROFEN 600 MG PO TABS: 600.0000 mg | ORAL_TABLET | Freq: Four times a day (QID) | ORAL | 0 refills | Status: DC | PRN

## 2021-04-28 MED ORDER — TETANUS-DIPHTH-ACELL PERTUSSIS 5-2.5-18.5 LF-MCG/0.5 IM SUSY
0.5000 mL | PREFILLED_SYRINGE | Freq: Once | INTRAMUSCULAR | Status: AC
  Administered 2021-04-28: 0.5 mL via INTRAMUSCULAR
  Filled 2021-04-28: qty 0.5

## 2021-04-28 MED ORDER — SULFAMETHOXAZOLE-TRIMETHOPRIM 800-160 MG PO TABS: 1.0000 | ORAL_TABLET | Freq: Two times a day (BID) | ORAL | 0 refills | Status: AC

## 2021-04-28 NOTE — Discharge Instructions (Signed)
Apply warm moist compress to affected area several times daily to help aid with healing.  Packing can be remove in 2 days, but it would be ok if it falls off before that.  Take antibiotic as prescribed.  Return if you have any concerns.

## 2021-04-28 NOTE — ED Triage Notes (Addendum)
Patient from home with complaint of abscess on the upper right cheek of his buttocks. Pt reports having the same abscess drained x 2 months ago, states he was placed on abx but did not finish them.
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
## 2021-04-29 ENCOUNTER — Other Ambulatory Visit: Payer: Self-pay

## 2021-05-02 ENCOUNTER — Telehealth: Payer: Self-pay

## 2021-05-02 NOTE — Telephone Encounter (Signed)
Transition Care Management Follow-up Telephone Call Date of discharge and from where: 04/28/2021 from [facility] How have you been since you were released from the hospital? Pt stated that he is feeling better. Pt has started abx tx from ED visit. Pt has also agreed to estabilsh care with PCP. Number for [facility] given to patient.  Any questions or concerns? No  Items Reviewed: Did the pt receive and understand the discharge instructions provided? Yes  Medications obtained and verified? Yes  Other? No  Any new allergies since your discharge? No  Dietary orders reviewed? No Do you have support at home? Yes   Functional Questionnaire: (I = Independent and D = Dependent) ADLs: I  Bathing/Dressing- I  Meal Prep- I  Eating- I  Maintaining continence- I  Transferring/Ambulation- I  Managing Meds- I   Follow up appointments reviewed:  PCP Hospital f/u appt confirmed? No  Pt is interested in establishing.  Specialist Hospital f/u appt confirmed? No   Are transportation arrangements needed? No  If their condition worsens, is the pt aware to call PCP or go to the Emergency Dept.? Yes Was the patient provided with contact information for the PCP's office or ED? Yes Was to pt encouraged to call back with questions or concerns? Yes

## 2021-06-01 ENCOUNTER — Encounter (HOSPITAL_BASED_OUTPATIENT_CLINIC_OR_DEPARTMENT_OTHER): Payer: Self-pay

## 2021-06-01 ENCOUNTER — Other Ambulatory Visit: Payer: Self-pay

## 2021-06-01 ENCOUNTER — Other Ambulatory Visit (HOSPITAL_BASED_OUTPATIENT_CLINIC_OR_DEPARTMENT_OTHER): Payer: Self-pay

## 2021-06-01 ENCOUNTER — Emergency Department (HOSPITAL_BASED_OUTPATIENT_CLINIC_OR_DEPARTMENT_OTHER)
Admission: EM | Admit: 2021-06-01 | Discharge: 2021-06-01 | Disposition: A | Discharge status: Home / Self Care | Payer: Medicaid Other | Attending: Emergency Medicine | Admitting: Emergency Medicine

## 2021-06-01 DIAGNOSIS — R197 Diarrhea, unspecified: Secondary | ICD-10-CM

## 2021-06-01 DIAGNOSIS — Z20822 Contact with and (suspected) exposure to covid-19: Secondary | ICD-10-CM | POA: Insufficient documentation

## 2021-06-01 DIAGNOSIS — R9431 Abnormal electrocardiogram [ECG] [EKG]: Secondary | ICD-10-CM | POA: Diagnosis not present

## 2021-06-01 DIAGNOSIS — B349 Viral infection, unspecified: Secondary | ICD-10-CM | POA: Insufficient documentation

## 2021-06-01 LAB — RESP PANEL BY RT-PCR (FLU A&B, COVID) ARPGX2
Influenza A by PCR: NEGATIVE
Influenza B by PCR: NEGATIVE
SARS Coronavirus 2 by RT PCR: NEGATIVE

## 2021-06-01 LAB — GROUP A STREP BY PCR: Group A Strep by PCR: NOT DETECTED

## 2021-06-01 MED ORDER — GUAIFENESIN 100 MG/5ML PO LIQD
5.0000 mL | ORAL | 0 refills | Status: DC | PRN
  Filled 2021-06-01: qty 120, 4d supply, fill #0

## 2021-06-01 MED ORDER — PSEUDOEPHEDRINE HCL ER 120 MG PO TB12
120.0000 mg | ORAL_TABLET | Freq: Two times a day (BID) | ORAL | 0 refills | Status: AC
  Filled 2021-06-01: qty 10, 5d supply, fill #0

## 2021-06-01 MED ORDER — KETOROLAC TROMETHAMINE 60 MG/2ML IM SOLN
60.0000 mg | Freq: Once | INTRAMUSCULAR | Status: AC
  Administered 2021-06-01: 60 mg via INTRAMUSCULAR
  Filled 2021-06-01: qty 2

## 2021-06-01 MED ORDER — FLUTICASONE PROPIONATE 50 MCG/ACT NA SUSP
1.0000 | Freq: Every day | NASAL | 0 refills | Status: AC
Start: 2021-06-01 — End: 2021-07-01
  Filled 2021-06-01: qty 16, 30d supply, fill #0
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
## 2021-06-01 NOTE — Discharge Instructions (Addendum)
May take Motrin or Tylenol as needed to help with your body aches, supportive care.  Drink lots of liquids over the next couple days.  Lots of rest.  It was a pleasure caring for you today in the emergency department.  Please return to the emergency department for any worsening or worrisome symptoms.

## 2021-06-02 ENCOUNTER — Telehealth: Payer: Self-pay

## 2021-06-02 NOTE — Telephone Encounter (Signed)
Transition Care Management Follow-up Telephone Call Date of discharge and from where: 06/01/2021 from [facility] How have you been since you were released from the hospital? Pt stated that he is feeling some better. Pt was able to go to work today. Pt stated that he forgot to pick up a work note for his son. I informed pt that the ED would be able to reprint it if needed.  Any questions or concerns? No  Items Reviewed: Did the pt receive and understand the discharge instructions provided? Yes  Medications obtained and verified? Yes  Other? No  Any new allergies since your discharge? No  Dietary orders reviewed? No Do you have support at home? Yes   Functional Questionnaire: (I = Independent and D = Dependent) ADLs: I  Bathing/Dressing- I  Meal Prep- I  Eating- I  Maintaining continence- I  Transferring/Ambulation- I  Managing Meds- I   Follow up appointments reviewed:  PCP Hospital f/u appt confirmed? No  Pt stated he would schedule to est as soon as he is able.  Specialist Hospital f/u appt confirmed? No   Are transportation arrangements needed? No  If their condition worsens, is the pt aware to call PCP or go to the Emergency Dept.? Yes Was the patient provided with contact information for the PCP's office or ED? Yes Was to pt encouraged to call back with questions or concerns? Yes

## 2021-08-11 ENCOUNTER — Other Ambulatory Visit (HOSPITAL_BASED_OUTPATIENT_CLINIC_OR_DEPARTMENT_OTHER): Payer: Self-pay

## 2021-08-11 ENCOUNTER — Emergency Department (HOSPITAL_BASED_OUTPATIENT_CLINIC_OR_DEPARTMENT_OTHER)
Admission: EM | Admit: 2021-08-11 | Discharge: 2021-08-11 | Disposition: A | Discharge status: Home / Self Care | Payer: Medicaid Other | Attending: Emergency Medicine | Admitting: Emergency Medicine

## 2021-08-11 ENCOUNTER — Other Ambulatory Visit: Payer: Self-pay

## 2021-08-11 ENCOUNTER — Encounter (HOSPITAL_BASED_OUTPATIENT_CLINIC_OR_DEPARTMENT_OTHER): Payer: Self-pay | Admitting: Emergency Medicine

## 2021-08-11 DIAGNOSIS — L729 Follicular cyst of the skin and subcutaneous tissue, unspecified: Secondary | ICD-10-CM | POA: Insufficient documentation

## 2021-08-11 DIAGNOSIS — R220 Localized swelling, mass and lump, head: Secondary | ICD-10-CM

## 2021-08-11 DIAGNOSIS — L720 Epidermal cyst: Secondary | ICD-10-CM | POA: Diagnosis not present

## 2021-08-11 MED ORDER — DOXYCYCLINE HYCLATE 100 MG PO CAPS
100.0000 mg | ORAL_CAPSULE | Freq: Two times a day (BID) | ORAL | 0 refills | Status: DC
  Filled 2021-08-11: qty 14, 7d supply, fill #0

## 2021-08-11 NOTE — ED Triage Notes (Signed)
Pt arrives to ED with c/o left sided facial swelling. This started today. Pt denies throat swelling and SOB.  ?

## 2021-08-11 NOTE — Discharge Instructions (Signed)
Take the antibiotic as directed.  For the scalp a skin cyst would recommend following up with dermatology for consideration for excision.  The left facial swelling right around the nose area is probably a skin infection developing.  Take the antibiotic doxycycline as directed.  Return for any new or worse symptoms. ?
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
## 2021-08-11 NOTE — ED Notes (Signed)
Patient verbalizes understanding of discharge instructions. Opportunity for questioning and answers were provided. Patient discharged from ED.  °

## 2021-08-12 ENCOUNTER — Telehealth: Payer: Self-pay

## 2021-08-12 DIAGNOSIS — Z0000 Encounter for general adult medical examination without abnormal findings: Secondary | ICD-10-CM | POA: Diagnosis not present

## 2021-08-12 DIAGNOSIS — Z1159 Encounter for screening for other viral diseases: Secondary | ICD-10-CM | POA: Diagnosis not present

## 2021-08-12 DIAGNOSIS — Z8679 Personal history of other diseases of the circulatory system: Secondary | ICD-10-CM | POA: Diagnosis not present

## 2021-08-12 NOTE — Telephone Encounter (Signed)
Transition Care Management Unsuccessful Follow-up Telephone Call ? ?Date of discharge and from where:  08/11/2021 from [facility] ? ?Attempts:  1st Attempt ? ?Reason for unsuccessful TCM follow-up call:  Left voice message ? ? ? ?

## 2021-08-15 NOTE — Telephone Encounter (Signed)
Transition Care Management Follow-up Telephone Call ?Date of discharge and from where: 08/11/2021 from [facility] ?How have you been since you were released from the hospital? Patient stated that he is feeling better and has started the abx rx'ed by the ED without difficulty.  ?Any questions or concerns? No ? ?Items Reviewed: ?Did the pt receive and understand the discharge instructions provided? Yes  ?Medications obtained and verified? Yes  ?Other? No  ?Any new allergies since your discharge? No  ?Dietary orders reviewed? No ?Do you have support at home? Yes  ? ?Functional Questionnaire: (I = Independent and D = Dependent) ?ADLs: I ? ?Bathing/Dressing- I ? ?Meal Prep- I ? ?Eating- I ? ?Maintaining continence- I ? ?Transferring/Ambulation- I ? ?Managing Meds- I ? ? ?Follow up appointments reviewed: ? ?PCP Hospital f/u appt confirmed? No  Patient stated that he is interested in establishing but will need to call back for assistance ?Specialist Hospital f/u appt confirmed? No   ?Are transportation arrangements needed? No  ?If their condition worsens, is the pt aware to call PCP or go to the Emergency Dept.? Yes ?Was the patient provided with contact information for the PCP's office or ED? Yes ?Was to pt encouraged to call back with questions or concerns? Yes ? ?

## 2021-09-14 DIAGNOSIS — Z789 Other specified health status: Secondary | ICD-10-CM | POA: Diagnosis not present

## 2021-09-14 DIAGNOSIS — F32A Depression, unspecified: Secondary | ICD-10-CM | POA: Diagnosis not present

## 2021-09-14 DIAGNOSIS — Z0189 Encounter for other specified special examinations: Secondary | ICD-10-CM | POA: Diagnosis not present

## 2021-09-14 DIAGNOSIS — R030 Elevated blood-pressure reading, without diagnosis of hypertension: Secondary | ICD-10-CM | POA: Diagnosis not present

## 2021-09-15 ENCOUNTER — Encounter (HOSPITAL_COMMUNITY): Payer: Self-pay

## 2021-09-15 ENCOUNTER — Other Ambulatory Visit: Payer: Self-pay

## 2021-09-15 ENCOUNTER — Ambulatory Visit (HOSPITAL_COMMUNITY)
Admission: RE | Admit: 2021-09-15 | Discharge: 2021-09-15 | Disposition: A | Discharge status: Home / Self Care | Payer: Medicaid Other | Source: Ambulatory Visit | Attending: Family Medicine | Admitting: Family Medicine

## 2021-09-15 VITALS — BP 117/72 | HR 71 | Temp 98.3°F | Resp 18

## 2021-09-15 DIAGNOSIS — L723 Sebaceous cyst: Secondary | ICD-10-CM
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
## 2021-09-15 NOTE — Discharge Instructions (Addendum)
At this time, I do not think that there is an abscess that needs to be drained.  You have a cyst in your skin that is new.  And needs to be removed with a different sort of procedure. ? ?If it becomes more hard around the cyst or more painful or red, then please return to be seen ?

## 2021-10-20 ENCOUNTER — Ambulatory Visit (INDEPENDENT_AMBULATORY_CARE_PROVIDER_SITE_OTHER): Payer: Medicaid Other | Admitting: Nurse Practitioner

## 2021-10-20 ENCOUNTER — Encounter (HOSPITAL_BASED_OUTPATIENT_CLINIC_OR_DEPARTMENT_OTHER): Payer: Self-pay

## 2021-10-20 ENCOUNTER — Encounter (HOSPITAL_BASED_OUTPATIENT_CLINIC_OR_DEPARTMENT_OTHER): Payer: Self-pay | Admitting: Nurse Practitioner

## 2021-10-20 ENCOUNTER — Ambulatory Visit (HOSPITAL_BASED_OUTPATIENT_CLINIC_OR_DEPARTMENT_OTHER): Payer: Medicaid Other | Admitting: Nurse Practitioner

## 2021-10-20 VITALS — BP 128/88 | HR 84 | Ht 73.0 in | Wt 187.0 lb

## 2021-10-20 DIAGNOSIS — Z0000 Encounter for general adult medical examination without abnormal findings: Secondary | ICD-10-CM | POA: Diagnosis not present

## 2021-10-20 DIAGNOSIS — I252 Old myocardial infarction: Secondary | ICD-10-CM

## 2021-10-20 DIAGNOSIS — L720 Epidermal cyst: Secondary | ICD-10-CM | POA: Diagnosis not present

## 2021-10-20 MED ORDER — IBUPROFEN 600 MG PO TABS: 600.0000 mg | ORAL_TABLET | Freq: Three times a day (TID) | ORAL | 1 refills | Status: DC | PRN

## 2021-10-20 NOTE — Patient Instructions (Signed)
Thank you for choosing [facility] for your Primary Care needs. I am excited for the opportunity to partner with you to meet your health care goals. It was a pleasure meeting you today!  Recommendations from today's visit: 7-10 days come back to have stitches removed I have sent the referral to cardiology. Please go when they call you I want to see you in a month or two and we will do a physical exam and labs for you.   Information on diet, exercise, and health maintenance recommendations are listed below. This is information to help you be sure you are on track for optimal health and monitoring.   Please look over this and let us know if you have any questions or if you have completed any of the health maintenance outside of [place] so that we can be sure your records are up to date.  ___________________________________________________________ About Me: I am an Adult-Geriatric Nurse Practitioner with a background in caring for patients for more than 20 years with a strong intensive care background. I provide primary care and sports medicine services to patients age 80 and older within this office. My education had a strong focus on caring for the older adult population, which I am passionate about. I am also the director of the APP Fellowship with [facility].   My desire is to provide you with the best service through preventive medicine and supportive care. I consider you a part of the medical team and value your input. I work diligently to ensure that you are heard and your needs are met in a safe and effective manner. I want you to feel comfortable with me as your provider and want you to know that your health concerns are important to me.  For your information, our office hours are: Monday, Tuesday, and Thursday 8:00 AM - 5:00 PM Wednesday and Friday 8:00 AM - 12:00 PM.   In my time away from the office I am teaching new APP's within the system and am  unavailable, but my partner, Dr. Burnard Bunting is in the office for emergent needs.   If you have questions or concerns, please call our office at [phone] or send [place] a MyChart message and we will respond as quickly as possible.  ____________________________________________________________ MyChart:  For all urgent or time sensitive needs we ask that you please call the office to avoid delays. Our number is [phone]. MyChart is not constantly monitored and due to the large volume of messages a day, replies may take up to 72 business hours.  MyChart Policy: MyChart allows for you to see your visit notes, after visit summary, provider recommendations, lab and tests results, make an appointment, request refills, and contact your provider or the office for non-urgent questions or concerns. Providers are seeing patients during normal business hours and do not have built in time to review MyChart messages.  We ask that you allow a minimum of 3 business days for responses to [employer]. For this reason, please do not send urgent requests through [place]. Please call the office at [phone]. New and ongoing conditions may require a visit. We have virtual and in person visit available for your convenience.  Complex MyChart concerns may require a visit. Your provider may request you schedule a virtual or in person visit to ensure we are providing the best care possible. MyChart messages sent after 11:00 AM on Friday will not be received by the provider until Monday morning.  Lab and Test Results: You will receive your lab and test results on MyChart as soon as they are completed and results have been sent by the lab or testing facility. Due to this service, you will receive your results BEFORE your provider.  I review lab and tests results each morning prior to seeing patients. Some results require collaboration with other providers to ensure you are receiving the most appropriate care. For this  reason, we ask that you please allow a minimum of 3-5 business days from the time the ALL results have been received for your provider to receive and review lab and test results and contact you about these.  Most lab and test result comments from the provider will be sent through [place]. Your provider may recommend changes to the plan of care, follow-up visits, repeat testing, ask questions, or request an office visit to discuss these results. You may reply directly to this message or call the office at [phone] to provide information for the provider or set up an appointment. In some instances, you will be called with test results and recommendations. Please let us know if this is preferred and we will make note of this in your chart to provide this for you.    If you have not heard a response to your lab or test results in 5 business days from all results returning to [place], please call the office to let us know. We ask that you please avoid calling prior to this time unless there is an emergent concern. Due to high call volumes, this can delay the resulting process.  After Hours: For all non-emergency after hours needs, please call the office at [phone] and select the option to reach the on-call provider service. On-call services are shared between multiple [place] offices and therefore it will not be possible to speak directly with your provider. On-call providers may provide medical advice and recommendations, but are unable to provide refills for maintenance medications.  For all emergency or urgent medical needs after normal business hours, we recommend that you seek care at the closest Urgent Care or Emergency Department to ensure appropriate treatment in a timely manner.  [facility] [place] has a 24 hour emergency room located on the ground floor for your convenience.   Urgent Concerns During the Business Day Providers are seeing patients from 8AM to [place] with a  busy schedule and are most often not able to respond to non-urgent calls until the end of the day or the next business day. If you should have URGENT concerns during the day, please call and speak to the nurse or schedule a same day appointment so that we can address your concern without delay.   Thank you, again, for choosing me as your health care partner. I appreciate your trust and look forward to learning more about you.   Worthy Keeler, DNP, AGNP-c ___________________________________________________________  Health Maintenance Recommendations Screening Testing Mammogram Every 1 -2 years based on history and risk factors Starting at age 43 Pap Smear Ages 21-39 every 3 years Ages 27-65 every 5 years with HPV testing More frequent testing may be required based on results and history Colon Cancer Screening Every 1-10 years based on test performed, risk factors, and history Starting at age 4 Bone Density Screening Every 2-10 years based on history Starting at age 59 for women Recommendations for men differ based on medication usage, history, and risk factors AAA Screening One time ultrasound Men 42-29 years old who have  every smoked Lung Cancer Screening Low Dose Lung CT every 12 months Age 25-80 years with a 30 pack-year smoking history who still smoke or who have quit within the last 15 years  Screening Labs Routine  Labs: Complete Blood Count (CBC), Complete Metabolic Panel (CMP), Cholesterol (Lipid Panel) Every 6-12 months based on history and medications May be recommended more frequently based on current conditions or previous results Hemoglobin A1c Lab Every 3-12 months based on history and previous results Starting at age 28 or earlier with diagnosis of diabetes, high cholesterol, BMI >26, and/or risk factors Frequent monitoring for patients with diabetes to ensure blood sugar control Thyroid Panel (TSH w/ T3 & T4) Every 6 months based on history, symptoms, and risk  factors May be repeated more often if on medication HIV One time testing for all patients 52 and older May be repeated more frequently for patients with increased risk factors or exposure Hepatitis C One time testing for all patients 20 and older May be repeated more frequently for patients with increased risk factors or exposure Gonorrhea, Chlamydia Every 12 months for all sexually active persons 13-24 years Additional monitoring may be recommended for those who are considered high risk or who have symptoms PSA Men 79-67 years old with risk factors Additional screening may be recommended from age 54-69 based on risk factors, symptoms, and history  Vaccine Recommendations Tetanus Booster All adults every 10 years Flu Vaccine All patients 6 months and older every year COVID Vaccine All patients 12 years and older Initial dosing with booster May recommend additional booster based on age and health history HPV Vaccine 2 doses all patients age 57-26 Dosing may be considered for patients over 26 Shingles Vaccine (Shingrix) 2 doses all adults 65 years and older Pneumonia (Pneumovax 23) All adults 15 years and older May recommend earlier dosing based on health history Pneumonia (Prevnar 17) All adults 60 years and older Dosed 1 year after Pneumovax 23  Additional Screening, Testing, and Vaccinations may be recommended on an individualized basis based on family history, health history, risk factors, and/or exposure.  __________________________________________________________  Diet Recommendations for All Patients  I recommend that all patients maintain a diet low in saturated fats, carbohydrates, and cholesterol. While this can be challenging at first, it is not impossible and small changes can make big differences.  Things to try: Decreasing the amount of soda, sweet tea, and/or juice to one or less per day and replace with water While water is always the first choice, if you do  not like water you may consider adding a water additive without sugar to improve the taste other sugar free drinks Replace potatoes with a brightly colored vegetable at dinner Use healthy oils, such as canola oil or olive oil, instead of butter or hard margarine Limit your bread intake to two pieces or less a day Replace regular pasta with low carb pasta options Bake, broil, or grill foods instead of frying Monitor portion sizes  Eat smaller, more frequent meals throughout the day instead of large meals  An important thing to remember is, if you love foods that are not great for your health, you don't have to give them up completely. Instead, allow these foods to be a reward when you have done well. Allowing yourself to still have special treats every once in a while is a nice way to tell yourself thank you for working hard to keep yourself healthy.   Also remember that every day is a new day.  If you have a bad day and "fall off the wagon", you can still climb right back up and keep moving along on your journey!  We have resources available to help you!  Some websites that may be helpful include: www.http://carter.biz/  Www.VeryWellFit.com _____________________________________________________________  Activity Recommendations for All Patients  I recommend that all adults get at least 20 minutes of moderate physical activity that elevates your heart rate at least 5 days out of the week.  Some examples include: Walking or jogging at a pace that allows you to carry on a conversation Cycling (stationary bike or outdoors) Water aerobics Yoga Weight lifting Dancing If physical limitations prevent you from putting stress on your joints, exercise in a pool or seated in a chair are excellent options.  Do determine your MAXIMUM heart rate for activity: YOUR AGE - 220 = MAX HeartRate   Remember! Do not push yourself too hard.  Start slowly and build up your pace, speed, weight, time in exercise,  etc.  Allow your body to rest between exercise and get good sleep. You will need more water than normal when you are exerting yourself. Do not wait until you are thirsty to drink. Drink with a purpose of getting in at least 8, 8 ounce glasses of water a day plus more depending on how much you exercise and sweat.    If you begin to develop dizziness, chest pain, abdominal pain, jaw pain, shortness of breath, headache, vision changes, lightheadedness, or other concerning symptoms, stop the activity and allow your body to rest. If your symptoms are severe, seek emergency evaluation immediately. If your symptoms are concerning, but not severe, please let us know so that we can recommend further evaluation.

## 2021-10-20 NOTE — Assessment & Plan Note (Signed)
Approximate 2 cm diameter soft, mobile mass noted on the lateral side of the scalp at the hairline.  Area is tender with palpation.  There is no drainage, erythema, edema to the surrounding tissue present.  No signs of infection. Verbal consent obtained for removal.  Risks and benefits discussed with patient and he agreed to continuation. Area cleansed with chlorhexidine swab x2 and sterile drape applied. 1% epinephrine with lidocaine buffered with HCO3 and a 3:1 ratio utilized to infiltrate the skin surrounding the mass. Sterile gloves applied and sterile technique utilized throughout procedure. After ensuring complete anesthesia of the area approximate 1.5 cm elliptical incision made to the dome of the mass using #11 blade. Forceps and scissors utilized to separate the outer casing of the mass from the surrounding tissue. Casing punctured and small amount of gray thick material drained to help facilitate removal without enlarging the incision space given the location. After drainage casing was removed from the cavity.  Area assessed for any remaining signs of drainage or casing matter. The skin was well approximated and held together with 3 sutures.  Skin adhesive was placed over the area of sutures given the patient's employment in an area that would increase the risk of infection. Patient tolerated procedure well with no concerning symptoms. Patient provided with discharge and follow-up information.  He will return to clinic in 7 days for suture removal.  Patient aware to contact the office with any new or alarm symptoms.
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
## 2021-10-20 NOTE — Assessment & Plan Note (Signed)
No alarm symptoms present today however patient has not followed up with cardiology routinely.  Lengthy discussion with patient today on the importance of following along with cardiology and ensuring that he is taking any necessary medications appropriately to help prevent further cardiovascular damage or disease.  Patient is agreeable to referral to cardiology today. Cardiac evaluation today within normal limits.  Referral sent.

## 2021-10-28 ENCOUNTER — Ambulatory Visit (HOSPITAL_BASED_OUTPATIENT_CLINIC_OR_DEPARTMENT_OTHER): Payer: Medicaid Other
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
## 2021-11-02 ENCOUNTER — Ambulatory Visit: Payer: Medicaid Other | Admitting: Cardiovascular Disease

## 2021-11-02 DIAGNOSIS — I252 Old myocardial infarction: Secondary | ICD-10-CM
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
## 2021-11-17 ENCOUNTER — Ambulatory Visit: Payer: Medicaid Other | Admitting: Cardiovascular Disease

## 2021-11-17 DIAGNOSIS — I252 Old myocardial infarction: Secondary | ICD-10-CM

## 2022-01-02 ENCOUNTER — Encounter (HOSPITAL_BASED_OUTPATIENT_CLINIC_OR_DEPARTMENT_OTHER): Payer: Self-pay | Admitting: Obstetrics and Gynecology

## 2022-01-02 ENCOUNTER — Other Ambulatory Visit: Payer: Self-pay

## 2022-01-02 ENCOUNTER — Emergency Department (HOSPITAL_BASED_OUTPATIENT_CLINIC_OR_DEPARTMENT_OTHER)
Admission: EM | Admit: 2022-01-02 | Discharge: 2022-01-02 | Discharge status: Against Medical Advice | Payer: Medicaid Other | Attending: Emergency Medicine | Admitting: Emergency Medicine

## 2022-01-02 ENCOUNTER — Emergency Department (HOSPITAL_BASED_OUTPATIENT_CLINIC_OR_DEPARTMENT_OTHER): Payer: Medicaid Other | Admitting: Radiology

## 2022-01-02 DIAGNOSIS — R0789 Other chest pain: Secondary | ICD-10-CM | POA: Diagnosis not present

## 2022-01-02 DIAGNOSIS — Z5321 Procedure and treatment not carried out due to patient leaving prior to being seen by health care provider: Secondary | ICD-10-CM | POA: Insufficient documentation

## 2022-01-02 DIAGNOSIS — R079 Chest pain, unspecified: Secondary | ICD-10-CM | POA: Diagnosis not present

## 2022-01-02 LAB — BASIC METABOLIC PANEL
Anion gap: 9 (ref 5–15)
BUN: 10 mg/dL (ref 6–20)
CO2: 25 mmol/L (ref 22–32)
Calcium: 8.9 mg/dL (ref 8.9–10.3)
Chloride: 109 mmol/L (ref 98–111)
Creatinine, Ser: 0.97 mg/dL (ref 0.61–1.24)
GFR, Estimated: >60 mL/min (ref >60)
Glucose, Bld: 107 mg/dL — ABNORMAL HIGH (ref 70–99)
Potassium: 4.1 mmol/L (ref 3.5–5.1)
Sodium: 143 mmol/L (ref 135–145)

## 2022-01-02 LAB — CBC
HCT: 42.6 % (ref 39.0–52.0)
Hemoglobin: 14.7 g/dL (ref 13.0–17.0)
MCH: 31.9 pg (ref 26.0–34.0)
MCHC: 34.5 g/dL (ref 30.0–36.0)
MCV: 92.4 fL (ref 80.0–100.0)
Platelets: 261 10*3/uL (ref 150–400)
RBC: 4.61 MIL/uL (ref 4.22–5.81)
RDW: 12.6 % (ref 11.5–15.5)
WBC: 7.2 10*3/uL (ref 4.0–10.5)
nRBC: 0 % (ref 0.0–0.2)

## 2022-01-02 LAB — TROPONIN I (HIGH SENSITIVITY): Troponin I (High Sensitivity): 4 ng/L (ref <18)

## 2022-01-02 NOTE — ED Triage Notes (Signed)
Patient reports to the ER for left sided chest pain. Reports a hx of heart attack. Reports pain 10/10. Patient reports lots of stress in his life and feels like "stress is killing him" as he is a single father of 3 children.

## 2022-01-08 ENCOUNTER — Other Ambulatory Visit: Payer: Self-pay

## 2022-01-08 ENCOUNTER — Encounter (HOSPITAL_BASED_OUTPATIENT_CLINIC_OR_DEPARTMENT_OTHER): Payer: Self-pay

## 2022-01-08 ENCOUNTER — Emergency Department (HOSPITAL_BASED_OUTPATIENT_CLINIC_OR_DEPARTMENT_OTHER): Payer: Medicaid Other | Admitting: Radiology

## 2022-01-08 ENCOUNTER — Emergency Department (HOSPITAL_BASED_OUTPATIENT_CLINIC_OR_DEPARTMENT_OTHER)
Admission: EM | Admit: 2022-01-08 | Discharge: 2022-01-08 | Disposition: A | Discharge status: Home / Self Care | Payer: Medicaid Other | Attending: Emergency Medicine | Admitting: Emergency Medicine

## 2022-01-08 DIAGNOSIS — M436 Torticollis: Secondary | ICD-10-CM | POA: Diagnosis not present

## 2022-01-08 DIAGNOSIS — M542 Cervicalgia: Secondary | ICD-10-CM | POA: Diagnosis not present

## 2022-01-08 LAB — GROUP A STREP BY PCR: Group A Strep by PCR: NOT DETECTED

## 2022-01-08 MED ORDER — METHOCARBAMOL 500 MG PO TABS
500.0000 mg | ORAL_TABLET | Freq: Once | ORAL | Status: AC
  Administered 2022-01-08: 500 mg via ORAL
  Filled 2022-01-08: qty 1

## 2022-01-08 MED ORDER — METHOCARBAMOL 500 MG PO TABS: 500.0000 mg | ORAL_TABLET | Freq: Two times a day (BID) | ORAL | 0 refills | Status: DC

## 2022-01-08 NOTE — ED Notes (Signed)
Pt verbalizes understanding of discharge instructions. Opportunity for questioning and answers were provided. Pt discharged from ED to home with father.    

## 2022-01-08 NOTE — ED Provider Notes (Signed)
[facility] EMERGENCY DEPT Provider Note   CSN: [id number] Arrival date & time: 01/08/22  1901     History  Chief Complaint  Patient presents with   Neck Pain    Phillip [name] is a 41 y.o. male who presents to the emergency department with concerns for right sided neck pain onset 2 days ago.  Notes that he woke up with the pain.  Unsure if he slept on it incorrectly.  Denies recent injury or trauma.  Denies fever, chest pain, shortness of breath, nausea, vomiting.  The history is provided by the patient. No language interpreter was used.       Home Medications Prior to Admission medications   Medication Sig Start Date End Date Taking? Authorizing Provider  methocarbamol (ROBAXIN) 500 MG tablet Take 1 tablet (500 mg total) by mouth 2 (two) times daily. 01/08/22  Yes Edem Tiegs A, PA-C  fluticasone (FLONASE) 50 MCG/ACT nasal spray Place 1 spray into both nostrils daily for 7 days. 06/01/21 07/01/21  Tanda Rockers A, DO  ibuprofen (ADVIL) 600 MG tablet Take 1 tablet (600 mg total) by mouth every 8 (eight) hours as needed. 10/20/21   Tollie Eth, NP      Allergies    Patient has no known allergies.    Review of Systems   Review of Systems  Constitutional:  Negative for fever.  Respiratory:  Negative for shortness of breath.   Cardiovascular:  Negative for chest pain.  Gastrointestinal:  Negative for nausea and vomiting.  Musculoskeletal:  Positive for neck pain.  All other systems reviewed and are negative.   Physical Exam Updated Vital Signs BP 131/77 (BP Location: Right Arm)   Pulse 82   Temp 98.3 F (36.8 C)   Resp 19   Ht 6\' 1"  (1.854 m)   Wt 81.6 kg   SpO2 99%   BMI 23.75 kg/m  Physical Exam Vitals and nursing note reviewed.  Constitutional:      General: He is not in acute distress.    Appearance: He is not diaphoretic.  HENT:     Head: Normocephalic and atraumatic.     Mouth/Throat:     Pharynx: No oropharyngeal exudate.  Eyes:      General: No scleral icterus.    Conjunctiva/sclera: Conjunctivae normal.  Neck:     Comments: Tenderness to palpation noted to right sided neck with spasm noted.  Tenderness to palpation noted to right trapezius area.  No spinal tenderness to palpation.  Decreased range of motion with flexion and lateral rotation of neck secondary to pain. Cardiovascular:     Rate and Rhythm: Normal rate and regular rhythm.     Pulses: Normal pulses.     Heart sounds: Normal heart sounds.  Pulmonary:     Effort: Pulmonary effort is normal. No respiratory distress.     Breath sounds: Normal breath sounds. No wheezing.  Chest:     Chest wall: No tenderness.  Abdominal:     General: Abdomen is flat. There is no distension.  Musculoskeletal:        General: Normal range of motion.     Cervical back: Neck supple.  Skin:    General: Skin is warm and dry.  Neurological:     Mental Status: He is alert.  Psychiatric:        Behavior: Behavior normal.     ED Results / Procedures / Treatments   Labs (all labs ordered are listed, but only abnormal results are displayed)  Labs Reviewed  GROUP A STREP BY PCR    EKG None  Radiology No results found.  Procedures Procedures    Medications Ordered in ED Medications  methocarbamol (ROBAXIN) tablet 500 mg (500 mg Oral Given 01/08/22 2152)    ED Course/ Medical Decision Making/ A&P                           Medical Decision Making Amount and/or Complexity of Data Reviewed Radiology: ordered.  Risk Prescription drug management.   Pt presents with right-sided neck pain onset 2 days.  Unsure if he slept incorrectly on it. Vital signs, patient afebrile. On exam, pt with Tenderness to palpation noted to right sided neck with spasm noted.  Tenderness to palpation noted to right trapezius area.  No spinal tenderness to palpation.  Decreased range of motion with flexion and lateral rotation of neck secondary to pain. No acute cardiovascular, respiratory,  abdominal exam findings. Differential diagnosis includes muscle spasm, torticollis, cellulitis.    Labs:  I ordered, and personally interpreted labs.  The pertinent results include:   Strep swab negative  Imaging: I ordered imaging studies including cervical x-ray ordered with results pending at time of signout.  Medications:  I ordered medication including warm compress, Robaxin for symptom management I have reviewed the patients home medicines and have made adjustments as needed  Patient case discussed with Dr. Renaye Rakers, at sign-out. Plan at sign-out is pending cervical x-ray, likely Discharge home, however, plans may change as per oncoming team. Patient care transferred at sign out.    This chart was dictated using voice recognition software, Dragon. Despite the best efforts of this provider to proofread and correct errors, errors may still occur which can change documentation meaning.  Final Clinical Impression(s) / ED Diagnoses Final diagnoses:  Torticollis    Rx / DC Orders ED Discharge Orders          Ordered    methocarbamol (ROBAXIN) 500 MG tablet  2 times daily        01/08/22 2157              Skye Plamondon A, PA-C 01/08/22 2200    Terald Sleeper, MD 01/09/22 1058

## 2022-01-08 NOTE — ED Triage Notes (Signed)
Pt presents to the ED with right sided neck pain. States that he woke up with it hurting on Friday and states that the pain has steadily worsened. No injury or trauma. No fevers. Reports a sore throat that started today.

## 2022-01-08 NOTE — Discharge Instructions (Addendum)
Your x-ray was negative in the ED today.  You will be sent a prescription for Robaxin, do not drive or operate heavy machinery while taking this medication as it can make you drowsy.  You may place a warm compress to the area for up to 15 minutes at a time, ensure to place a barrier between your skin and the heat.  Call your primary care provider to set up a follow-up appointment regarding today's ED visit.  Return to the ED if you are experiencing increasing/worsening inability to move your neck, fever, worsening symptoms.

## 2022-01-10 ENCOUNTER — Other Ambulatory Visit (HOSPITAL_BASED_OUTPATIENT_CLINIC_OR_DEPARTMENT_OTHER): Payer: Self-pay | Admitting: Nurse Practitioner

## 2022-01-10 DIAGNOSIS — Z114 Encounter for screening for human immunodeficiency virus [HIV]: Secondary | ICD-10-CM

## 2022-01-11 ENCOUNTER — Other Ambulatory Visit (HOSPITAL_BASED_OUTPATIENT_CLINIC_OR_DEPARTMENT_OTHER): Payer: Self-pay | Admitting: Nurse Practitioner

## 2022-01-11 ENCOUNTER — Telehealth (HOSPITAL_BASED_OUTPATIENT_CLINIC_OR_DEPARTMENT_OTHER): Payer: Self-pay

## 2022-01-11 DIAGNOSIS — Z114 Encounter for screening for human immunodeficiency virus [HIV]: Secondary | ICD-10-CM | POA: Diagnosis not present

## 2022-01-11 NOTE — Telephone Encounter (Signed)
Phillip [name] with [facility] informed there was not enough blood drawn for the labs. I spoke with Phillip [name] as I reached out to the patient and his phone number is disconnected. Phillip [name] advised me to try his emergency contacts, I called both numbers on file and they are both disconnected. Please advise?

## 2022-01-12 LAB — HIV ANTIBODY (ROUTINE TESTING W REFLEX): HIV Screen 4th Generation wRfx: NONREACTIVE

## 2022-04-21 ENCOUNTER — Encounter (HOSPITAL_BASED_OUTPATIENT_CLINIC_OR_DEPARTMENT_OTHER): Payer: Self-pay | Admitting: Family Medicine

## 2022-04-21 ENCOUNTER — Ambulatory Visit (INDEPENDENT_AMBULATORY_CARE_PROVIDER_SITE_OTHER): Payer: Medicaid Other | Admitting: Family Medicine

## 2022-04-21 VITALS — BP 148/88 | HR 60 | Ht 73.0 in | Wt 182.6 lb

## 2022-04-21 DIAGNOSIS — R229 Localized swelling, mass and lump, unspecified: Secondary | ICD-10-CM

## 2022-04-21 MED ORDER — AMOXICILLIN-POT CLAVULANATE 875-125 MG PO TABS: 1.0000 | ORAL_TABLET | Freq: Two times a day (BID) | ORAL | 0 refills | Status: DC

## 2022-04-21 NOTE — Progress Notes (Signed)
    Procedures performed today:    None.  Independent interpretation of notes and tests performed by another provider:   None.  Brief History, Exam, Impression, and Recommendations:    BP (!) 148/88 (BP Location: Left Arm, Patient Position: Sitting, Cuff Size: Large)   Pulse 60   Ht 6\' 1"  (1.854 m)   Wt 182 lb 9.6 oz (82.8 kg)   SpO2 100%   BMI 24.09 kg/m   Soft tissue swelling Patient presents for evaluation of facial swelling.  He reports that he did have recent dental pain over the last day or so.  This morning he awoke and had swelling on the left side of his face/cheek.  Area has some associated mild discomfort.  He has not had any recent fevers.  He reports that he will intermittently have chills which is a chronic issue for patient, no change in the symptoms.  He has not noticed any discharge or other dental changes.  He reports that he does not have a dentist that he sees regularly. On exam, patient is in no acute distress, vital signs stable, patient is afebrile.  On visual inspection, he does have slight increased prominence of her left cheek, above adage of left aspect of lip.  Orally, patient does have several missing/broken teeth.  Evidence of gingival hypertrophy/inflammation.  No obvious discharge noted at this time.  He does have mild discomfort to palpation over area of swelling, no fluctuance noted. Feel that local soft tissue swelling represents infectious process and suspect that it is related to dental etiology.  Patient with poor oral hygiene, missing and broken teeth.  Patient also with tobacco use.  These all increase risk for dental etiology and infectious process.  Given this, we will initiate antibiotic treatment with Augmentin.  Also advised that he needs to have further evaluation with a dentist locally.  He does not have a dentist that he sees regularly.  Advised that he will need to contact local offices to see who may be able to get him in for definitive  treatment.  Discussed that I feel that source of current issue is from a dental issue and thus definitive treatment will require dental evaluation.  Patient voiced understanding and agreement  Return in about 3 months (around 07/21/2022).   ___________________________________________ Lance Huaracha de 09/20/2022, MD, ABFM, CAQSM Primary Care and Sports Medicine [facility]

## 2022-04-21 NOTE — Patient Instructions (Signed)
  Medication Instructions:  Your physician recommends that you continue on your current medications as directed. Please refer to the Current Medication list given to you today. --If you need a refill on any your medications before your next appointment, please call your pharmacy first. If no refills are authorized on file call the office.-- Lab Work: Your physician has recommended that you have lab work today: No If you have labs (blood work) drawn today and your tests are completely normal, you will receive your results via MyChart message OR a phone call from our staff.  Please ensure you check your voicemail in the event that you authorized detailed messages to be left on a delegated number. If you have any lab test that is abnormal or we need to change your treatment, we will call you to review the results.  Referrals/Procedures/Imaging: No  Follow-Up: Your next appointment:   Your physician recommends that you schedule a follow-up appointment in: 3 months with Dr. de Cuba.  You will receive a text message or e-mail with a link to a survey about your care and experience with us today! We would greatly appreciate your feedback!   Thanks for letting us be apart of your health journey!!  Primary Care and Sports Medicine   Dr. Raymond de Cuba   We encourage you to activate your patient portal called "MyChart".  Sign up information is provided on this After Visit Summary.  MyChart is used to connect with patients for Virtual Visits (Telemedicine).  Patients are able to view lab/test results, encounter notes, upcoming appointments, etc.  Non-urgent messages can be sent to your provider as well. To learn more about what you can do with MyChart, please visit --  [email or web address].    

## 2022-04-21 NOTE — Assessment & Plan Note (Signed)
Patient presents for evaluation of facial swelling.  He reports that he did have recent dental pain over the last day or so.  This morning he awoke and had swelling on the left side of his face/cheek.  Area has some associated mild discomfort.  He has not had any recent fevers.  He reports that he will intermittently have chills which is a chronic issue for patient, no change in the symptoms.  He has not noticed any discharge or other dental changes.  He reports that he does not have a dentist that he sees regularly. On exam, patient is in no acute distress, vital signs stable, patient is afebrile.  On visual inspection, he does have slight increased prominence of her left cheek, above adage of left aspect of lip.  Orally, patient does have several missing/broken teeth.  Evidence of gingival hypertrophy/inflammation.  No obvious discharge noted at this time.  He does have mild discomfort to palpation over area of swelling, no fluctuance noted. Feel that local soft tissue swelling represents infectious process and suspect that it is related to dental etiology.  Patient with poor oral hygiene, missing and broken teeth.  Patient also with tobacco use.  These all increase risk for dental etiology and infectious process.  Given this, we will initiate antibiotic treatment with Augmentin.  Also advised that he needs to have further evaluation with a dentist locally.  He does not have a dentist that he sees regularly.  Advised that he will need to contact local offices to see who may be able to get him in for definitive treatment.  Discussed that I feel that source of current issue is from a dental issue and thus definitive treatment will require dental evaluation.  Patient voiced understanding and agreement

## 2022-05-30 ENCOUNTER — Emergency Department (HOSPITAL_BASED_OUTPATIENT_CLINIC_OR_DEPARTMENT_OTHER): Payer: Medicaid Other | Admitting: Radiology

## 2022-05-30 ENCOUNTER — Other Ambulatory Visit: Payer: Self-pay

## 2022-05-30 ENCOUNTER — Encounter (HOSPITAL_BASED_OUTPATIENT_CLINIC_OR_DEPARTMENT_OTHER): Payer: Self-pay | Admitting: Emergency Medicine

## 2022-05-30 ENCOUNTER — Emergency Department (HOSPITAL_BASED_OUTPATIENT_CLINIC_OR_DEPARTMENT_OTHER)
Admission: EM | Admit: 2022-05-30 | Discharge: 2022-05-30 | Disposition: A | Discharge status: Home / Self Care | Payer: Medicaid Other | Attending: Emergency Medicine | Admitting: Emergency Medicine

## 2022-05-30 DIAGNOSIS — F439 Reaction to severe stress, unspecified: Secondary | ICD-10-CM | POA: Diagnosis not present

## 2022-05-30 DIAGNOSIS — R0789 Other chest pain: Secondary | ICD-10-CM

## 2022-05-30 DIAGNOSIS — R079 Chest pain, unspecified: Secondary | ICD-10-CM | POA: Diagnosis not present

## 2022-05-30 LAB — CBC
HCT: 41 % (ref 39.0–52.0)
Hemoglobin: 13.9 g/dL (ref 13.0–17.0)
MCH: 31.5 pg (ref 26.0–34.0)
MCHC: 33.9 g/dL (ref 30.0–36.0)
MCV: 93 fL (ref 80.0–100.0)
Platelets: 234 10*3/uL (ref 150–400)
RBC: 4.41 MIL/uL (ref 4.22–5.81)
RDW: 12.8 % (ref 11.5–15.5)
WBC: 6.4 10*3/uL (ref 4.0–10.5)
nRBC: 0 % (ref 0.0–0.2)

## 2022-05-30 LAB — TROPONIN I (HIGH SENSITIVITY): Troponin I (High Sensitivity): 2 ng/L (ref <18)

## 2022-05-30 LAB — BASIC METABOLIC PANEL
Anion gap: 7 (ref 5–15)
BUN: 17 mg/dL (ref 6–20)
CO2: 26 mmol/L (ref 22–32)
Calcium: 9.5 mg/dL (ref 8.9–10.3)
Chloride: 107 mmol/L (ref 98–111)
Creatinine, Ser: 0.97 mg/dL (ref 0.61–1.24)
GFR, Estimated: >60 mL/min (ref >60)
Glucose, Bld: 120 mg/dL — ABNORMAL HIGH (ref 70–99)
Potassium: 3.8 mmol/L (ref 3.5–5.1)
Sodium: 140 mmol/L (ref 135–145)

## 2022-05-30 NOTE — ED Provider Notes (Signed)
[facility] EMERGENCY DEPT Provider Note   CSN: [id number] Arrival date & time: 05/30/22  1811     History Chief Complaint  Patient presents with   Chest Pain    Phillip [name] is a 42 y.o. male.   Chest Pain Associated symptoms: no nausea, no shortness of breath and no vomiting   Patient presented to the emergency room with complaints of chest pain began earlier today.  Patient reports that chest pain is centralized and towards left side of chest.  Patient reports he also had a previous MI and feels that symptoms are similar at this time, last NSTEMI occurred in early 2010s but patient is not sure of the exact date.  Patient did verbalize to me that he believes that chest pain could partly be due to significant stress anxiety has been experiencing recently.  Patient has not seen cardiology in the last 2 to 3 years and does not have frequent follow-up with primary care provider.    Home Medications Prior to Admission medications   Medication Sig Start Date End Date Taking? Authorizing Provider  amoxicillin-clavulanate (AUGMENTIN) 875-125 MG tablet Take 1 tablet by mouth 2 (two) times daily. 04/21/22   de [place], Blondell Reveal, MD  fluticasone (FLONASE) 50 MCG/ACT nasal spray Place 1 spray into both nostrils daily for 7 days. 06/01/21 07/01/21  Wynona Dove A, DO  ibuprofen (ADVIL) 600 MG tablet Take 1 tablet (600 mg total) by mouth every 8 (eight) hours as needed. Patient not taking: Reported on 04/21/2022 10/20/21   Early, Coralee Pesa, NP  methocarbamol (ROBAXIN) 500 MG tablet Take 1 tablet (500 mg total) by mouth 2 (two) times daily. Patient not taking: Reported on 04/21/2022 01/08/22   Blue, Soijett A, PA-C      Allergies    Patient has no known allergies.    Review of Systems   Review of Systems  Constitutional:  Negative for chills.  Respiratory:  Negative for chest tightness and shortness of breath.   Cardiovascular:  Positive for chest pain.  Gastrointestinal:  Negative for  nausea and vomiting.  All other systems reviewed and are negative.   Physical Exam Updated Vital Signs BP (!) 128/90   Pulse 67   Temp 97.8 F (36.6 C)   Resp 19   SpO2 97%  Physical Exam Vitals and nursing note reviewed.  Constitutional:      General: He is not in acute distress.    Appearance: He is well-developed. He is not ill-appearing.  Eyes:     Pupils: Pupils are equal, round, and reactive to light.  Cardiovascular:     Rate and Rhythm: Normal rate and regular rhythm.     Heart sounds: Normal heart sounds. Heart sounds not distant. No murmur heard. Pulmonary:     Effort: Pulmonary effort is normal. No tachypnea or respiratory distress.     Breath sounds: Normal breath sounds.  Chest:     Chest wall: No mass, deformity, tenderness or crepitus.  Abdominal:     General: Bowel sounds are normal.     Palpations: Abdomen is soft.  Skin:    General: Skin is warm and dry.     Capillary Refill: Capillary refill takes less than 2 seconds.  Neurological:     General: No focal deficit present.     Mental Status: He is alert.     ED Results / Procedures / Treatments   Labs (all labs ordered are listed, but only abnormal results are displayed) Labs Reviewed  BASIC METABOLIC PANEL - Abnormal; Notable for the following components:      Result Value   Glucose, Bld 120 (*)    All other components within normal limits  CBC  TROPONIN I (HIGH SENSITIVITY)    EKG None  Radiology DG Chest 2 View  Result Date: 05/30/2022 CLINICAL DATA:  Chest pain. EXAM: CHEST - 2 VIEW COMPARISON:  Chest x-ray 01/02/2022. FINDINGS: The heart size and mediastinal contours are within normal limits. Both lungs are clear. No visible pleural effusions or pneumothorax. No acute osseous abnormality. IMPRESSION: No active cardiopulmonary disease. Electronically Signed   By: Margaretha Sheffield M.D.   On: 05/30/2022 19:08    Procedures Procedures   Medications Ordered in ED Medications - No data  to display  ED Course/ Medical Decision Making/ A&P                             Medical Decision Making Amount and/or Complexity of Data Reviewed Labs: ordered. Radiology: ordered.   This patient presents to the ED for concern of chest pain.  Differential diagnosis includes NSTEMI, ACS, pneumothorax, bronchitis, postviral cough syndrome   Lab Tests:  I Ordered, and personally interpreted labs.  The pertinent results include: Negative troponin, normal BMP and CBC   Imaging Studies ordered:  I ordered imaging studies including chest x-ray I independently visualized and interpreted imaging which showed no cardiopulmonary disease I agree with the radiologist interpretation   Medicines ordered and prescription drug management:  I have reviewed the patients home medicines and have made adjustments as needed   Problem List / ED Course:  Patient presented to emergency with complaints of chest pain noted present for several hours today.  Patient does report that he feels that he has been under an increased amount of stress recently due to family strains.  Patient has not had recent follow-up with cardiology in the last 2 to 3 years and has also had recent follow-up with primary care provider.  Patient has not previously been diagnosed with any kind of anxiety or mental health condition, but does feel that he likely is experiencing significant anxiety from the family strain.  Given patient's reassuring lab work with negative troponin and normal EKG, advised patient he should follow-up with cardiology outpatient for further evaluation as to possible underlying cause to chest pain but also plan to follow-up with primary care provider for possible evaluation for anxiety. Patient was agreeable to plan to discharge home and verbalized understanding all return precautions.  Patient have any further questions at the end of this assessment.  Final Clinical Impression(s) / ED Diagnoses Final  diagnoses:  Other chest pain  Stress    Rx / DC Orders ED Discharge Orders     None         Luvenia Heller, PA-C 45/40/98 [id number]    Campbell Stall P, DO 47/82/95 [id number]

## 2022-05-30 NOTE — Discharge Instructions (Addendum)
You were seen in the emergency department today for chest pain.  All of your labs and EKG were reassuring without signs of any cardiac cause of the chest pain that you are feeling today.  Based on what you reported to me, there is a possibility that stress/anxiety could be playing into the chest pain that you have been experiencing recently.  He should plan to follow-up with your primary care provider for further dilation possible management of the stress and anxiety as appropriate.  Also make sure that you can follow-up with your cardiologist as he have not seen him in the last 2 to 3 years.

## 2022-05-30 NOTE — ED Notes (Signed)
Patient transported to X-ray 

## 2022-05-30 NOTE — ED Triage Notes (Signed)
Sharp pain in left side chest. Reports this feels the same as when he had his heart attack 2020 Started last night. Constant. Worse with movement. \some fatigue and lightheadedness

## 2022-07-19 ENCOUNTER — Encounter (HOSPITAL_BASED_OUTPATIENT_CLINIC_OR_DEPARTMENT_OTHER): Payer: Self-pay | Admitting: Emergency Medicine

## 2022-07-19 ENCOUNTER — Emergency Department (HOSPITAL_BASED_OUTPATIENT_CLINIC_OR_DEPARTMENT_OTHER): Payer: Medicaid Other

## 2022-07-19 ENCOUNTER — Emergency Department (HOSPITAL_BASED_OUTPATIENT_CLINIC_OR_DEPARTMENT_OTHER)
Admission: EM | Admit: 2022-07-19 | Discharge: 2022-07-19 | Disposition: A | Discharge status: Home / Self Care | Payer: Medicaid Other | Attending: Emergency Medicine | Admitting: Emergency Medicine

## 2022-07-19 ENCOUNTER — Other Ambulatory Visit: Payer: Self-pay

## 2022-07-19 DIAGNOSIS — R1011 Right upper quadrant pain: Secondary | ICD-10-CM | POA: Diagnosis not present

## 2022-07-19 DIAGNOSIS — K828 Other specified diseases of gallbladder: Secondary | ICD-10-CM | POA: Diagnosis not present

## 2022-07-19 DIAGNOSIS — R1013 Epigastric pain: Secondary | ICD-10-CM | POA: Diagnosis not present

## 2022-07-19 DIAGNOSIS — R079 Chest pain, unspecified: Secondary | ICD-10-CM | POA: Diagnosis not present

## 2022-07-19 DIAGNOSIS — F17210 Nicotine dependence, cigarettes, uncomplicated: Secondary | ICD-10-CM | POA: Insufficient documentation

## 2022-07-19 LAB — COMPREHENSIVE METABOLIC PANEL
ALT: 14 U/L (ref 0–44)
AST: 24 U/L (ref 15–41)
Albumin: 4.8 g/dL (ref 3.5–5.0)
Alkaline Phosphatase: 68 U/L (ref 38–126)
Anion gap: 9 (ref 5–15)
BUN: 13 mg/dL (ref 6–20)
CO2: 24 mmol/L (ref 22–32)
Calcium: 10.2 mg/dL (ref 8.9–10.3)
Chloride: 105 mmol/L (ref 98–111)
Creatinine, Ser: 0.96 mg/dL (ref 0.61–1.24)
GFR, Estimated: >60 mL/min (ref >60)
Glucose, Bld: 95 mg/dL (ref 70–99)
Potassium: 3.9 mmol/L (ref 3.5–5.1)
Sodium: 138 mmol/L (ref 135–145)
Total Bilirubin: 0.7 mg/dL (ref 0.3–1.2)
Total Protein: 8 g/dL (ref 6.5–8.1)

## 2022-07-19 LAB — CBC WITH DIFFERENTIAL/PLATELET
Abs Immature Granulocytes: 0.04 10*3/uL (ref 0.00–0.07)
Basophils Absolute: 0 10*3/uL (ref 0.0–0.1)
Basophils Relative: 0 %
Eosinophils Absolute: 0 10*3/uL (ref 0.0–0.5)
Eosinophils Relative: 0 %
HCT: 44.6 % (ref 39.0–52.0)
Hemoglobin: 15.2 g/dL (ref 13.0–17.0)
Immature Granulocytes: 0 %
Lymphocytes Relative: 9 %
Lymphs Abs: 1 10*3/uL (ref 0.7–4.0)
MCH: 31.7 pg (ref 26.0–34.0)
MCHC: 34.1 g/dL (ref 30.0–36.0)
MCV: 92.9 fL (ref 80.0–100.0)
Monocytes Absolute: 0.6 10*3/uL (ref 0.1–1.0)
Monocytes Relative: 6 %
Neutro Abs: 9.4 10*3/uL — ABNORMAL HIGH (ref 1.7–7.7)
Neutrophils Relative %: 85 %
Platelets: 248 10*3/uL (ref 150–400)
RBC: 4.8 MIL/uL (ref 4.22–5.81)
RDW: 12.7 % (ref 11.5–15.5)
WBC: 11.1 10*3/uL — ABNORMAL HIGH (ref 4.0–10.5)
nRBC: 0 % (ref 0.0–0.2)

## 2022-07-19 LAB — TROPONIN I (HIGH SENSITIVITY)
Troponin I (High Sensitivity): 3 ng/L
Troponin I (High Sensitivity): 4 ng/L (ref <18)

## 2022-07-19 LAB — LIPASE, BLOOD: Lipase: 14 U/L (ref 11–51)

## 2022-07-19 MED ORDER — ALUM & MAG HYDROXIDE-SIMETH 200-200-20 MG/5ML PO SUSP
15.0000 mL | Freq: Once | ORAL | Status: AC
  Administered 2022-07-19: 15 mL via ORAL
  Filled 2022-07-19: qty 30

## 2022-07-19 MED ORDER — FAMOTIDINE 20 MG PO TABS
20.0000 mg | ORAL_TABLET | Freq: Once | ORAL | Status: AC
  Administered 2022-07-19: 20 mg via ORAL
  Filled 2022-07-19: qty 1

## 2022-07-19 MED ORDER — ONDANSETRON HCL 4 MG/2ML IJ SOLN
4.0000 mg | Freq: Once | INTRAMUSCULAR | Status: AC
  Administered 2022-07-19: 4 mg via INTRAVENOUS
  Filled 2022-07-19: qty 2

## 2022-07-19 MED ORDER — ONDANSETRON HCL 4 MG PO TABS: 4.0000 mg | ORAL_TABLET | Freq: Four times a day (QID) | ORAL | 0 refills | Status: DC | PRN

## 2022-07-19 MED ORDER — HYDROXYZINE HCL 25 MG PO TABS: 25.0000 mg | ORAL_TABLET | Freq: Four times a day (QID) | ORAL | 0 refills | Status: AC | PRN

## 2022-07-19 MED ORDER — PANTOPRAZOLE SODIUM 20 MG PO TBEC: 20.0000 mg | DELAYED_RELEASE_TABLET | Freq: Every day | ORAL | 1 refills | Status: DC

## 2022-07-19 NOTE — ED Triage Notes (Signed)
Pt reports epigastric abdominal pain and diaphoresis that started today. Pt reports it feels like indigestion but "the pain won't go away." Pt with hx of cardiac stents.

## 2022-07-19 NOTE — ED Provider Notes (Signed)
[facility] Provider Note   CSN: [id number] Arrival date & time: 07/19/22  1219     History  Chief Complaint  Patient presents with   Abdominal Pain    Phillip [name] is a 42 y.o. male.   Abdominal Pain   42 year old male presents emergency department with complaints of epigastric abdominal pain.  Patient states that symptoms began after awakening early this morning.  States he had associated nausea with 2-3 episodes of nonbloody emesis.  Also reports having looser stools and has "constantly been using the bathroom" of which began today as well.  States that son is sick with similar nausea, vomiting and diarrhea and has been so for the past 3 to 4 days.  Reports history of NSTEMI with negative and states this pain is significantly different.  Denies chest pain, shortness of breath, fever, chills, cough, congestion, urinary symptoms, hematochezia/melena.  Has not tried to eat anything since symptom onset and has taken no medication for said symptoms.  Patient also reports increased feelings of anxiousness/stress.  States that he is single father with 3 kids at home and his kids been asking more about the absent mother which has been "distressing" for the patient.  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 Authorizing Provider  hydrOXYzine (ATARAX) 25 MG tablet Take 1 tablet (25 mg total) by mouth every 6 (six) hours as needed for anxiety. 07/19/22  Yes Dion Saucier A, PA  ondansetron (ZOFRAN) 4 MG tablet Take 1 tablet (4 mg total) by mouth every 6 (six) hours as needed for nausea or vomiting. 07/19/22  Yes Dion Saucier A, PA  pantoprazole (PROTONIX) 20 MG tablet Take 1 tablet (20 mg total) by mouth daily. 07/19/22  Yes Dion Saucier A, PA   amoxicillin-clavulanate (AUGMENTIN) 875-125 MG tablet Take 1 tablet by mouth 2 (two) times daily. 04/21/22   [place], Blondell Reveal, MD  fluticasone (FLONASE) 50 MCG/ACT nasal spray Place 1 spray into both nostrils daily for 7 days. 06/01/21 07/01/21  Wynona Dove A, DO  ibuprofen (ADVIL) 600 MG tablet Take 1 tablet (600 mg total) by mouth every 8 (eight) hours as needed. Patient not taking: Reported on 04/21/2022 10/20/21   Early, Coralee Pesa, NP  methocarbamol (ROBAXIN) 500 MG tablet Take 1 tablet (500 mg total) by mouth 2 (two) times daily. Patient not taking: Reported on 04/21/2022 01/08/22   Blue, Soijett A, PA-C      Allergies    Patient has no known allergies.    Review of Systems   Review of Systems  Gastrointestinal:  Positive for abdominal pain.  All other systems reviewed and are negative.   Physical Exam Updated Vital Signs BP 136/86   Pulse (!) 56   Temp 98.9 F (37.2 C) (Oral)   Resp 16   SpO2 98%  Physical Exam Vitals and nursing note reviewed.  Constitutional:      General: He is not in acute distress.    Appearance: He is well-developed.  HENT:     Head: Normocephalic and atraumatic.  Eyes:     Conjunctiva/sclera: Conjunctivae normal.  Cardiovascular:     Rate and Rhythm: Normal rate and regular rhythm.     Heart sounds: No murmur heard. Pulmonary:     Effort: Pulmonary effort is normal. No respiratory distress.  Breath sounds: Normal breath sounds.  Abdominal:     Palpations: Abdomen is soft.     Tenderness: There is abdominal tenderness in the right upper quadrant and epigastric area. There is no right CVA tenderness or left CVA tenderness.  Musculoskeletal:        General: No swelling.     Cervical back: Neck supple.  Skin:    General: Skin is warm and dry.     Capillary Refill: Capillary refill takes less than 2 seconds.  Neurological:     Mental Status: He is alert.  Psychiatric:        Mood and Affect: Mood normal.     ED Results / Procedures /  Treatments   Labs (all labs ordered are listed, but only abnormal results are displayed) Labs Reviewed  CBC WITH DIFFERENTIAL/PLATELET - Abnormal; Notable for the following components:      Result Value   WBC 11.1 (*)    Neutro Abs 9.4 (*)    All other components within normal limits  COMPREHENSIVE METABOLIC PANEL  LIPASE, BLOOD  TROPONIN I (HIGH SENSITIVITY)  TROPONIN I (HIGH SENSITIVITY)    EKG None  Radiology DG Chest Port 1 View  Result Date: 07/19/2022 CLINICAL DATA:  Chest pain, epigastric pain EXAM: PORTABLE CHEST 1 VIEW COMPARISON:  Radiograph 05/30/2022 FINDINGS: The heart size and mediastinal contours are within normal limits.No focal airspace disease. No pleural effusion or pneumothorax.No acute osseous abnormality. IMPRESSION: No evidence of acute cardiopulmonary disease. Electronically Signed   By: Maurine Simmering M.D.   On: 07/19/2022 14:52   US Abdomen Limited RUQ (LIVER/GB)  Result Date: 07/19/2022 CLINICAL DATA:  Right upper quadrant pain. EXAM: ULTRASOUND ABDOMEN LIMITED RIGHT UPPER QUADRANT COMPARISON:  CT abdomen/pelvis 01/15/2020. FINDINGS: Gallbladder: Contracted gallbladder. No gallstones or wall thickening visualized. No sonographic Murphy sign noted by sonographer. Common bile duct: Diameter: 4.3 mm Liver: 17 mm simple cyst in the left lobe of the liver, unchanged from prior CT. Within normal limits in parenchymal echogenicity. Portal vein is patent on color Doppler imaging with normal direction of blood flow towards the liver. Other: None. IMPRESSION: 1. Contracted gallbladder without sonographic evidence of acute cholecystitis. 2. 17 mm simple cyst in the left lobe of the liver, unchanged from prior CT. Electronically Signed   By: Emmit Alexanders M.D.   On: 07/19/2022 14:03    Procedures Procedures    Medications Ordered in ED Medications  alum & mag hydroxide-simeth (MAALOX/MYLANTA) 200-200-20 MG/5ML suspension 15 mL (15 mLs Oral Given 07/19/22 1309)  famotidine  (PEPCID) tablet 20 mg (20 mg Oral Given 07/19/22 1309)  ondansetron (ZOFRAN) injection 4 mg (4 mg Intravenous Given 07/19/22 1253)    ED Course/ Medical Decision Making/ A&P                             Medical Decision Making Amount and/or Complexity of Data Reviewed Labs: ordered. Radiology: ordered.  Risk OTC drugs. Prescription drug management.   This patient presents to the ED for concern of abdominal pain, this involves an extensive number of treatment options, and is a complaint that carries with it a high risk of complications and morbidity.  The differential diagnosis includes ACS, gastritis, PUD, pancreatitis, CBD pathology, cholecystitis, SBO/LBO, nephrolithiasis, pyelonephritis, cystitis, mesenteric ischemia, AAA, aortic dissection   Co morbidities that complicate the patient evaluation  See HPI   Additional history obtained:  Additional history obtained from EMR External records from outside source  obtained and reviewed including hospital records   Lab Tests:  I Ordered, and personally interpreted labs.  The pertinent results include: Mild leukocytosis of 11.1.  No evidence of anemia.  Platelets within range.  No electrolyte abnormalities appreciated.  No transaminitis.  No renal dysfunction.  Lipase within normal limits.  Initial troponin of 3 with repeat for; EKG concerning for sinus rhythm without acute ischemic changes   Imaging Studies ordered:  I ordered imaging studies including chest x-ray, right upper quadrant ultrasound I independently visualized and interpreted imaging which showed  Chest x-ray: No acute cardiopulmonary abdomen Right upper quadrant ultrasound: Contracted all bladder.  17 mm simple cyst of the left lobe of liver I agree with the radiologist interpretation  Cardiac Monitoring: / EKG:  The patient was maintained on a cardiac monitor.  I personally viewed and interpreted the cardiac monitored which showed an underlying rhythm of: Sinus  rhythm without acute ischemic changes   Consultations Obtained:  N/a   Problem List / ED Course / Critical interventions / Medication management  Epigastric abdominal pain I ordered medication including Maalox, Pepcid, Zofran   Reevaluation of the patient after these medicines showed that the patient resolved I have reviewed the patients home medicines and have made adjustments as needed   Social Determinants of Health:  Reports cigarette, alcohol, marijuana use   Test / Admission - Considered:  Epigastric abdominal pain Vitals signs  within normal range and stable throughout visit. Laboratory/imaging studies significant for: See above Patient symptoms most likely secondary to gastritis/GERD.  Patient's workup today overall reassuring with resolution of symptoms with administration of GI cocktail.  Patient with delta negative troponins with no acute changes on EKG so doubt ACS.  Patient with heart score of 0-3.  Patient Wells PE score 0 and PERC negative so doubt PE.  Doubt pneumonia, aortic dissection, pneumothorax stable, pericarditis/myocarditis/tamponade.  Patient recommended continued outpatient therapy via PPI as well as dietary changes consistent with treatment of GERD.  Patient also with increased anxious feelings given life circumstances and requesting medical therapy.  Will send in hydroxyzine to take as needed as well given counseling resources for further discussion of life circumstances.  Recommend reevaluation by primary care for reassessment of symptoms.  Treatment plan discussed at length with patient and he acknowledged understanding was agreeable to said plan. Worrisome signs and symptoms were discussed with the patient, and the patient acknowledged understanding to return to the ED if noticed. Patient was stable upon discharge.          Final Clinical Impression(s) / ED Diagnoses Final diagnoses:  Epigastric abdominal pain    Rx / DC Orders ED Discharge  Orders          Ordered    pantoprazole (PROTONIX) 20 MG tablet  Daily        07/19/22 1529    ondansetron (ZOFRAN) 4 MG tablet  Every 6 hours PRN        07/19/22 1529    hydrOXYzine (ATARAX) 25 MG tablet  Every 6 hours PRN        07/19/22 1529              Wilnette Kales, [place] 07/19/22 1804    Davonna Belling, MD 07/20/22 [phone]

## 2022-07-19 NOTE — Discharge Instructions (Addendum)
With your workup today was overall reassuring.  As discussed, symptoms most likely secondary to GERD.  Will treat this with medicine called Protonix to take daily.  Recommend follow-up with primary care regarding reevaluation of symptoms.  You can find Maalox over-the-counter; it is a liquid medicine given to you while in the emergency department.  I also sent a medicine called Zofran or transection for treatment of feelings of nausea or episodes of vomiting. I will also send a medicine called hydroxyzine to take as needed for feelings of anxiety.  Attached is information for counseling to discuss anxiety/stress with professional.  The same resource sheets has substance abuse counseling so ignore those resources and look for resources that are pertinent to your life situation. Please do not hesitate to return to emergency department for worrisome signs and symptoms we discussed become apparent.

## 2022-07-19 NOTE — ED Notes (Signed)
Patient drank some water.  States does not want anything else to eat or drink

## 2022-07-21 ENCOUNTER — Ambulatory Visit (HOSPITAL_BASED_OUTPATIENT_CLINIC_OR_DEPARTMENT_OTHER): Payer: Medicaid Other | Admitting: Family Medicine

## 2022-12-26 ENCOUNTER — Emergency Department (HOSPITAL_BASED_OUTPATIENT_CLINIC_OR_DEPARTMENT_OTHER)
Admission: EM | Admit: 2022-12-26 | Discharge: 2022-12-26 | Disposition: A | Discharge status: Home / Self Care | Payer: Medicaid Other | Attending: Emergency Medicine | Admitting: Emergency Medicine

## 2022-12-26 ENCOUNTER — Other Ambulatory Visit: Payer: Self-pay

## 2022-12-26 DIAGNOSIS — L0501 Pilonidal cyst with abscess: Secondary | ICD-10-CM | POA: Insufficient documentation

## 2022-12-26 DIAGNOSIS — M7918 Myalgia, other site: Secondary | ICD-10-CM | POA: Diagnosis present

## 2022-12-26 MED ORDER — HYDROCODONE-ACETAMINOPHEN 5-325 MG PO TABS: 1.0000 | ORAL_TABLET | Freq: Four times a day (QID) | ORAL | 0 refills | Status: DC | PRN

## 2022-12-26 MED ORDER — LIDOCAINE-EPINEPHRINE (PF) 2 %-1:200000 IJ SOLN
10.0000 mL | Freq: Once | INTRAMUSCULAR | Status: AC
  Administered 2022-12-26: 10 mL
  Filled 2022-12-26: qty 20

## 2022-12-26 MED ORDER — DOXYCYCLINE HYCLATE 100 MG PO TABS
100.0000 mg | ORAL_TABLET | Freq: Once | ORAL | Status: AC
  Administered 2022-12-26: 100 mg via ORAL
  Filled 2022-12-26: qty 1

## 2022-12-26 MED ORDER — DOXYCYCLINE HYCLATE 100 MG PO CAPS: 100.0000 mg | ORAL_CAPSULE | Freq: Two times a day (BID) | ORAL | 0 refills | Status: DC

## 2022-12-26 NOTE — ED Notes (Signed)
Discharge paperwork given and verbally understood. 

## 2022-12-26 NOTE — ED Provider Notes (Signed)
[facility] EMERGENCY DEPARTMENT AT [facility] Provider Note   CSN: [id number] Arrival date & time: 12/26/22  1630     History  No chief complaint on file.   Phillip [name] is a 42 y.o. male.  Patient is a 42 year old male presenting today with worsening pain and swelling in his right buttocks near the gluteal cleft.  He has had I&D's in the past.  The pain has gotten significantly worse over the last 24 hours but he has not noticed any drainage.  No systemic symptoms.  The history is provided by the patient.       Home Medications Prior to Admission medications   Medication Sig Start Date End Date Taking? Authorizing Provider  doxycycline (VIBRAMYCIN) 100 MG capsule Take 1 capsule (100 mg total) by mouth 2 (two) times daily. 12/26/22  Yes Gwyneth Sprout, MD  HYDROcodone-acetaminophen (NORCO/VICODIN) 5-325 MG tablet Take 1 tablet by mouth every 6 (six) hours as needed for severe pain. 12/26/22  Yes Gwyneth Sprout, MD  amoxicillin-clavulanate (AUGMENTIN) 875-125 MG tablet Take 1 tablet by mouth 2 (two) times daily. 04/21/22   de [place], Buren Kos, MD  fluticasone (FLONASE) 50 MCG/ACT nasal spray Place 1 spray into both nostrils daily for 7 days. 06/01/21 07/01/21  Sloan Leiter, DO  hydrOXYzine (ATARAX) 25 MG tablet Take 1 tablet (25 mg total) by mouth every 6 (six) hours as needed for anxiety. 07/19/22   Peter Garter, PA  ibuprofen (ADVIL) 600 MG tablet Take 1 tablet (600 mg total) by mouth every 8 (eight) hours as needed. Patient not taking: Reported on 04/21/2022 10/20/21   Early, Sung Amabile, NP  methocarbamol (ROBAXIN) 500 MG tablet Take 1 tablet (500 mg total) by mouth 2 (two) times daily. Patient not taking: Reported on 04/21/2022 01/08/22   Blue, Soijett A, PA-C  ondansetron (ZOFRAN) 4 MG tablet Take 1 tablet (4 mg total) by mouth every 6 (six) hours as needed for nausea or vomiting. 07/19/22   Sherian Maroon A, PA  pantoprazole (PROTONIX) 20 MG tablet Take 1 tablet (20 mg  total) by mouth daily. 07/19/22   Peter Garter, PA      Allergies    Patient has no known allergies.    Review of Systems   Review of Systems  Physical Exam Updated Vital Signs BP 126/68 (BP Location: Right Arm)   Pulse (!) 59   Temp 98.5 F (36.9 C) (Oral)   Resp 16   SpO2 100%  Physical Exam Vitals and nursing note reviewed.  Constitutional:      General: He is not in acute distress.    Appearance: He is well-developed.  HENT:     Head: Normocephalic and atraumatic.  Eyes:     Conjunctiva/sclera: Conjunctivae normal.     Pupils: Pupils are equal, round, and reactive to light.  Cardiovascular:     Rate and Rhythm: Normal rate and regular rhythm.     Heart sounds: No murmur heard. Pulmonary:     Effort: Pulmonary effort is normal. No respiratory distress.  Musculoskeletal:        General: No tenderness. Normal range of motion.     Cervical back: Normal range of motion and neck supple.  Skin:    General: Skin is warm and dry.     Findings: Abscess present. No erythema or rash.       Neurological:     Mental Status: He is alert and oriented to person, place, and time.  Psychiatric:  Behavior: Behavior normal.     ED Results / Procedures / Treatments   Labs (all labs ordered are listed, but only abnormal results are displayed) Labs Reviewed - No data to display  EKG None  Radiology No results found.  Procedures Procedures    INCISION AND DRAINAGE Performed by: Gwyneth Sprout Consent: Verbal consent obtained. Risks and benefits: risks, benefits and alternatives were discussed Type: abscess  Body area: gluteal cleft  Anesthesia: local infiltration  Incision was made with a scalpel.  Local anesthetic: lidocaine 2% with epinephrine  Anesthetic total: 5 ml  Complexity: complex Blunt dissection to break up loculations  Drainage: purulent  Drainage amount: 2mL  Packing material: 1/4 in iodoform gauze  Patient tolerance: Patient  tolerated the procedure well with no immediate complications.    Medications Ordered in ED Medications  doxycycline (VIBRA-TABS) tablet 100 mg (has no administration in time range)  lidocaine-EPINEPHrine (XYLOCAINE W/EPI) 2 %-1:200000 (PF) injection 10 mL (10 mLs Infiltration Given by Other 12/26/22 1944)    ED Course/ Medical Decision Making/ A&P                                 Medical Decision Making Risk Prescription drug management.   Patient presenting today with a pilonidal abscess.  I&D as above.  No rectal involvement.  No systemic symptoms.  Pt treated with doxy and given return precautions and post I&D instructions        Final Clinical Impression(s) / ED Diagnoses Final diagnoses:  Pilonidal abscess    Rx / DC Orders ED Discharge Orders          Ordered    doxycycline (VIBRAMYCIN) 100 MG capsule  2 times daily        12/26/22 1947    HYDROcodone-acetaminophen (NORCO/VICODIN) 5-325 MG tablet  Every 6 hours PRN        12/26/22 1947              Gwyneth Sprout, MD 12/26/22 1947
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
## 2022-12-26 NOTE — Discharge Instructions (Addendum)
Start taking the antibiotic tomorrow and do heating pad or warm soaks for 20 minutes at a time multiple times a day.  If the packing is still there in 2 days you can pull it out.  If within 48 hours symptoms are not improving the area is getting worse return to the emergency room

## 2023-01-18 ENCOUNTER — Emergency Department (HOSPITAL_COMMUNITY)
Admission: EM | Admit: 2023-01-18 | Discharge: 2023-01-18 | Disposition: A | Discharge status: Other Acute Inpatient Hospital | Payer: Worker's Compensation | Attending: Emergency Medicine | Admitting: Emergency Medicine

## 2023-01-18 DIAGNOSIS — T310 Burns involving less than 10% of body surface: Secondary | ICD-10-CM | POA: Diagnosis not present

## 2023-01-18 DIAGNOSIS — T22112A Burn of first degree of left forearm, initial encounter: Secondary | ICD-10-CM | POA: Insufficient documentation

## 2023-01-18 DIAGNOSIS — X088XXA Exposure to other specified smoke, fire and flames, initial encounter: Secondary | ICD-10-CM | POA: Diagnosis not present

## 2023-01-18 DIAGNOSIS — T22111A Burn of first degree of right forearm, initial encounter: Secondary | ICD-10-CM | POA: Diagnosis present

## 2023-01-18 DIAGNOSIS — R202 Paresthesia of skin: Secondary | ICD-10-CM | POA: Diagnosis not present

## 2023-01-18 DIAGNOSIS — T22212A Burn of second degree of left forearm, initial encounter: Secondary | ICD-10-CM | POA: Diagnosis not present

## 2023-01-18 DIAGNOSIS — R0689 Other abnormalities of breathing: Secondary | ICD-10-CM | POA: Diagnosis not present

## 2023-01-18 DIAGNOSIS — T300 Burn of unspecified body region, unspecified degree: Secondary | ICD-10-CM

## 2023-01-18 DIAGNOSIS — Z23 Encounter for immunization: Secondary | ICD-10-CM | POA: Insufficient documentation

## 2023-01-18 DIAGNOSIS — R000 Tachycardia, unspecified: Secondary | ICD-10-CM | POA: Diagnosis not present

## 2023-01-18 DIAGNOSIS — T22211A Burn of second degree of right forearm, initial encounter: Secondary | ICD-10-CM | POA: Diagnosis not present

## 2023-01-18 LAB — CBC WITH DIFFERENTIAL/PLATELET
Abs Immature Granulocytes: 0.02 10*3/uL (ref 0.00–0.07)
Basophils Absolute: 0.1 10*3/uL (ref 0.0–0.1)
Basophils Relative: 1 %
Eosinophils Absolute: 0.1 10*3/uL (ref 0.0–0.5)
Eosinophils Relative: 1 %
HCT: 43.8 % (ref 39.0–52.0)
Hemoglobin: 14.6 g/dL (ref 13.0–17.0)
Immature Granulocytes: 0 %
Lymphocytes Relative: 39 %
Lymphs Abs: 3.8 10*3/uL (ref 0.7–4.0)
MCH: 31.3 pg (ref 26.0–34.0)
MCHC: 33.3 g/dL (ref 30.0–36.0)
MCV: 94 fL (ref 80.0–100.0)
Monocytes Absolute: 1 10*3/uL (ref 0.1–1.0)
Monocytes Relative: 10 %
Neutro Abs: 4.7 10*3/uL (ref 1.7–7.7)
Neutrophils Relative %: 49 %
Platelets: 282 10*3/uL (ref 150–400)
RBC: 4.66 MIL/uL (ref 4.22–5.81)
RDW: 13.1 % (ref 11.5–15.5)
WBC: 9.6 10*3/uL (ref 4.0–10.5)
nRBC: 0 % (ref 0.0–0.2)

## 2023-01-18 LAB — COMPREHENSIVE METABOLIC PANEL
ALT: 16 U/L (ref 0–44)
AST: 27 U/L (ref 15–41)
Albumin: 4.3 g/dL (ref 3.5–5.0)
Alkaline Phosphatase: 59 U/L (ref 38–126)
Anion gap: 18 — ABNORMAL HIGH (ref 5–15)
BUN: 15 mg/dL (ref 6–20)
CO2: 19 mmol/L — ABNORMAL LOW (ref 22–32)
Calcium: 9.6 mg/dL (ref 8.9–10.3)
Chloride: 104 mmol/L (ref 98–111)
Creatinine, Ser: 1.3 mg/dL — ABNORMAL HIGH (ref 0.61–1.24)
GFR, Estimated: >60 mL/min (ref >60)
Glucose, Bld: 115 mg/dL — ABNORMAL HIGH (ref 70–99)
Potassium: 4 mmol/L (ref 3.5–5.1)
Sodium: 141 mmol/L (ref 135–145)
Total Bilirubin: 0.6 mg/dL (ref 0.3–1.2)
Total Protein: 7.6 g/dL (ref 6.5–8.1)

## 2023-01-18 MED ORDER — HYDROMORPHONE HCL 1 MG/ML IJ SOLN
INTRAMUSCULAR | Status: AC
  Filled 2023-01-18: qty 1

## 2023-01-18 MED ORDER — KETAMINE HCL 50 MG/5ML IJ SOSY
15.0000 mg | PREFILLED_SYRINGE | Freq: Once | INTRAMUSCULAR | Status: AC
  Administered 2023-01-18: 15 mg via INTRAVENOUS
  Filled 2023-01-18: qty 5

## 2023-01-18 MED ORDER — FENTANYL CITRATE PF 50 MCG/ML IJ SOSY
50.0000 ug | PREFILLED_SYRINGE | Freq: Once | INTRAMUSCULAR | Status: AC
  Administered 2023-01-18: 50 ug via INTRAVENOUS

## 2023-01-18 MED ORDER — HYDROMORPHONE HCL 1 MG/ML IJ SOLN
1.0000 mg | Freq: Once | INTRAMUSCULAR | Status: AC
  Administered 2023-01-18: 1 mg via INTRAVENOUS

## 2023-01-18 MED ORDER — FENTANYL CITRATE PF 50 MCG/ML IJ SOSY
PREFILLED_SYRINGE | INTRAMUSCULAR | Status: AC
  Filled 2023-01-18: qty 1

## 2023-01-18 MED ORDER — KETOROLAC TROMETHAMINE 15 MG/ML IJ SOLN
30.0000 mg | Freq: Once | INTRAMUSCULAR | Status: AC
  Administered 2023-01-18: 30 mg via INTRAVENOUS
  Filled 2023-01-18: qty 2

## 2023-01-18 MED ORDER — LACTATED RINGERS IV BOLUS
1000.0000 mL | Freq: Once | INTRAVENOUS | Status: AC
  Administered 2023-01-18: 1000 mL via INTRAVENOUS

## 2023-01-18 MED ORDER — KETAMINE HCL 50 MG/5ML IJ SOSY
15.0000 mg | PREFILLED_SYRINGE | Freq: Once | INTRAMUSCULAR | Status: AC
  Administered 2023-01-18: 15 mg via INTRAVENOUS

## 2023-01-18 MED ORDER — HYDROMORPHONE HCL 1 MG/ML IJ SOLN
1.0000 mg | Freq: Once | INTRAMUSCULAR | Status: AC
  Administered 2023-01-18: 1 mg via INTRAVENOUS
  Filled 2023-01-18: qty 1

## 2023-01-18 MED ORDER — LORAZEPAM 2 MG/ML IJ SOLN
1.0000 mg | Freq: Once | INTRAMUSCULAR | Status: AC
  Administered 2023-01-18: 1 mg via INTRAVENOUS
  Filled 2023-01-18: qty 1

## 2023-01-18 MED ORDER — TETANUS-DIPHTH-ACELL PERTUSSIS 5-2.5-18.5 LF-MCG/0.5 IM SUSY
0.5000 mL | PREFILLED_SYRINGE | Freq: Once | INTRAMUSCULAR | Status: AC
  Administered 2023-01-18: 0.5 mL via INTRAMUSCULAR
  Filled 2023-01-18: qty 0.5

## 2023-01-18 MED ORDER — CEFAZOLIN SODIUM-DEXTROSE 2-4 GM/100ML-% IV SOLN
2.0000 g | Freq: Once | INTRAVENOUS | Status: AC
  Administered 2023-01-18: 2 g via INTRAVENOUS
  Filled 2023-01-18: qty 100

## 2023-01-18 MED ORDER — FENTANYL CITRATE PF 50 MCG/ML IJ SOSY
50.0000 ug | PREFILLED_SYRINGE | Freq: Once | INTRAMUSCULAR | Status: AC
  Administered 2023-01-18: 50 ug via INTRAVENOUS
  Filled 2023-01-18: qty 1
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
## 2023-01-19 DIAGNOSIS — T22212A Burn of second degree of left forearm, initial encounter: Secondary | ICD-10-CM | POA: Diagnosis not present

## 2023-01-19 DIAGNOSIS — T22211A Burn of second degree of right forearm, initial encounter: Secondary | ICD-10-CM | POA: Diagnosis not present

## 2023-01-19 DIAGNOSIS — T2200XA Burn of unspecified degree of shoulder and upper limb, except wrist and hand, unspecified site, initial encounter: Secondary | ICD-10-CM | POA: Diagnosis not present

## 2023-01-19 DIAGNOSIS — T310 Burns involving less than 10% of body surface: Secondary | ICD-10-CM | POA: Diagnosis not present

## 2023-01-20 DIAGNOSIS — T310 Burns involving less than 10% of body surface: Secondary | ICD-10-CM | POA: Diagnosis not present

## 2023-01-20 DIAGNOSIS — T2200XA Burn of unspecified degree of shoulder and upper limb, except wrist and hand, unspecified site, initial encounter: Secondary | ICD-10-CM | POA: Diagnosis not present

## 2023-01-23 DIAGNOSIS — T22212A Burn of second degree of left forearm, initial encounter: Secondary | ICD-10-CM | POA: Diagnosis not present

## 2023-01-23 DIAGNOSIS — T22211A Burn of second degree of right forearm, initial encounter: Secondary | ICD-10-CM | POA: Diagnosis not present

## 2023-03-24 ENCOUNTER — Emergency Department (HOSPITAL_BASED_OUTPATIENT_CLINIC_OR_DEPARTMENT_OTHER): Payer: Medicaid Other

## 2023-03-24 ENCOUNTER — Other Ambulatory Visit: Payer: Self-pay

## 2023-03-24 ENCOUNTER — Emergency Department (HOSPITAL_BASED_OUTPATIENT_CLINIC_OR_DEPARTMENT_OTHER)
Admission: EM | Admit: 2023-03-24 | Discharge: 2023-03-25 | Disposition: A | Discharge status: Home / Self Care | Payer: Medicaid Other | Attending: Emergency Medicine | Admitting: Emergency Medicine

## 2023-03-24 ENCOUNTER — Encounter (HOSPITAL_BASED_OUTPATIENT_CLINIC_OR_DEPARTMENT_OTHER): Payer: Self-pay

## 2023-03-24 DIAGNOSIS — R1013 Epigastric pain: Secondary | ICD-10-CM | POA: Diagnosis not present

## 2023-03-24 DIAGNOSIS — R109 Unspecified abdominal pain: Secondary | ICD-10-CM | POA: Diagnosis not present

## 2023-03-24 DIAGNOSIS — K2900 Acute gastritis without bleeding: Secondary | ICD-10-CM | POA: Insufficient documentation

## 2023-03-24 LAB — COMPREHENSIVE METABOLIC PANEL
ALT: 59 U/L — ABNORMAL HIGH (ref 0–44)
AST: 50 U/L — ABNORMAL HIGH (ref 15–41)
Albumin: 4.3 g/dL (ref 3.5–5.0)
Alkaline Phosphatase: 190 U/L — ABNORMAL HIGH (ref 38–126)
Anion gap: 9 (ref 5–15)
BUN: 9 mg/dL (ref 6–20)
CO2: 27 mmol/L (ref 22–32)
Calcium: 9.8 mg/dL (ref 8.9–10.3)
Chloride: 97 mmol/L — ABNORMAL LOW (ref 98–111)
Creatinine, Ser: 0.99 mg/dL (ref 0.61–1.24)
GFR, Estimated: >60 mL/min (ref >60)
Glucose, Bld: 160 mg/dL — ABNORMAL HIGH (ref 70–99)
Potassium: 3.7 mmol/L (ref 3.5–5.1)
Sodium: 133 mmol/L — ABNORMAL LOW (ref 135–145)
Total Bilirubin: 0.5 mg/dL (ref <1.2)
Total Protein: 8.7 g/dL — ABNORMAL HIGH (ref 6.5–8.1)

## 2023-03-24 LAB — CBC WITH DIFFERENTIAL/PLATELET
Abs Immature Granulocytes: 0.03 10*3/uL (ref 0.00–0.07)
Basophils Absolute: 0.1 10*3/uL (ref 0.0–0.1)
Basophils Relative: 1 %
Eosinophils Absolute: 0.2 10*3/uL (ref 0.0–0.5)
Eosinophils Relative: 2 %
HCT: 41 % (ref 39.0–52.0)
Hemoglobin: 13.9 g/dL (ref 13.0–17.0)
Immature Granulocytes: 0 %
Lymphocytes Relative: 14 %
Lymphs Abs: 1.4 10*3/uL (ref 0.7–4.0)
MCH: 30.9 pg (ref 26.0–34.0)
MCHC: 33.9 g/dL (ref 30.0–36.0)
MCV: 91.1 fL (ref 80.0–100.0)
Monocytes Absolute: 0.7 10*3/uL (ref 0.1–1.0)
Monocytes Relative: 7 %
Neutro Abs: 7.6 10*3/uL (ref 1.7–7.7)
Neutrophils Relative %: 76 %
Platelets: 355 10*3/uL (ref 150–400)
RBC: 4.5 MIL/uL (ref 4.22–5.81)
RDW: 13 % (ref 11.5–15.5)
WBC: 10.1 10*3/uL (ref 4.0–10.5)
nRBC: 0 % (ref 0.0–0.2)

## 2023-03-24 LAB — LIPASE, BLOOD: Lipase: 66 U/L — ABNORMAL HIGH (ref 11–51)

## 2023-03-24 MED ORDER — IOHEXOL 300 MG/ML  SOLN
100.0000 mL | Freq: Once | INTRAMUSCULAR | Status: AC | PRN
  Administered 2023-03-25: 100 mL via INTRAVENOUS

## 2023-03-24 MED ORDER — ONDANSETRON HCL 4 MG/2ML IJ SOLN
4.0000 mg | Freq: Once | INTRAMUSCULAR | Status: AC
  Administered 2023-03-24: 4 mg via INTRAVENOUS
  Filled 2023-03-24: qty 2

## 2023-03-24 MED ORDER — ALUM & MAG HYDROXIDE-SIMETH 200-200-20 MG/5ML PO SUSP
30.0000 mL | Freq: Once | ORAL | Status: AC
  Administered 2023-03-24: 30 mL via ORAL
  Filled 2023-03-24: qty 30

## 2023-03-24 MED ORDER — HYDROMORPHONE HCL 1 MG/ML IJ SOLN
1.0000 mg | Freq: Once | INTRAMUSCULAR | Status: AC
  Administered 2023-03-24: 1 mg via INTRAVENOUS
  Filled 2023-03-24: qty 1

## 2023-03-24 NOTE — ED Triage Notes (Signed)
Pt to triage c/o epigastric abd pain 8/10 dull in nature x 4 days. Pt denies fever chills NVD. Pt denies CP SOB. Last BM at 10 am today. VSS NAD PT on room air. IV established EKG completed.

## 2023-03-25 DIAGNOSIS — R590 Localized enlarged lymph nodes: Secondary | ICD-10-CM | POA: Diagnosis not present

## 2023-03-25 DIAGNOSIS — R109 Unspecified abdominal pain: Secondary | ICD-10-CM | POA: Diagnosis not present

## 2023-03-25 DIAGNOSIS — K769 Liver disease, unspecified: Secondary | ICD-10-CM | POA: Diagnosis not present

## 2023-03-25 MED ORDER — PANTOPRAZOLE SODIUM 40 MG PO TBEC: 40.0000 mg | DELAYED_RELEASE_TABLET | Freq: Two times a day (BID) | ORAL | 0 refills | Status: AC

## 2023-03-25 MED ORDER — SUCRALFATE 1 GM/10ML PO SUSP: 1.0000 g | Freq: Three times a day (TID) | ORAL | 0 refills | Status: DC

## 2023-03-25 MED ORDER — FAMOTIDINE 20 MG PO TABS: 20.0000 mg | ORAL_TABLET | Freq: Two times a day (BID) | ORAL | 0 refills | Status: DC

## 2023-03-25 MED ORDER — LIDOCAINE VISCOUS HCL 2 % MT SOLN
15.0000 mL | Freq: Once | OROMUCOSAL | Status: AC
  Administered 2023-03-25: 15 mL via ORAL
  Filled 2023-03-25: qty 15

## 2023-03-25 MED ORDER — PANTOPRAZOLE SODIUM 40 MG PO TBEC: 40.0000 mg | DELAYED_RELEASE_TABLET | Freq: Two times a day (BID) | ORAL | 0 refills | Status: DC

## 2023-03-25 MED ORDER — PANTOPRAZOLE SODIUM 40 MG IV SOLR
40.0000 mg | Freq: Once | INTRAVENOUS | Status: AC
  Administered 2023-03-25: 40 mg via INTRAVENOUS
  Filled 2023-03-25: qty 10

## 2023-03-25 MED ORDER — FAMOTIDINE 20 MG PO TABS: 20.0000 mg | ORAL_TABLET | Freq: Two times a day (BID) | ORAL | 0 refills | Status: AC

## 2023-03-25 MED ORDER — ALUM & MAG HYDROXIDE-SIMETH 200-200-20 MG/5ML PO SUSP
30.0000 mL | Freq: Once | ORAL | Status: AC
  Administered 2023-03-25: 30 mL via ORAL
  Filled 2023-03-25: qty 30

## 2023-03-25 MED ORDER — SUCRALFATE 1 GM/10ML PO SUSP: 1.0000 g | Freq: Three times a day (TID) | ORAL | 0 refills | Status: AC
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
## 2023-07-14 ENCOUNTER — Emergency Department (HOSPITAL_BASED_OUTPATIENT_CLINIC_OR_DEPARTMENT_OTHER)
Admission: EM | Admit: 2023-07-14 | Discharge: 2023-07-14 | Disposition: A | Discharge status: Home / Self Care | Attending: Emergency Medicine | Admitting: Emergency Medicine

## 2023-07-14 ENCOUNTER — Other Ambulatory Visit: Payer: Self-pay

## 2023-07-14 ENCOUNTER — Encounter (HOSPITAL_BASED_OUTPATIENT_CLINIC_OR_DEPARTMENT_OTHER): Payer: Self-pay | Admitting: Emergency Medicine

## 2023-07-14 DIAGNOSIS — M25521 Pain in right elbow: Secondary | ICD-10-CM | POA: Insufficient documentation

## 2023-07-14 DIAGNOSIS — M25522 Pain in left elbow: Secondary | ICD-10-CM | POA: Insufficient documentation

## 2023-07-14 DIAGNOSIS — M7711 Lateral epicondylitis, right elbow: Secondary | ICD-10-CM | POA: Diagnosis not present

## 2023-07-14 DIAGNOSIS — Z872 Personal history of diseases of the skin and subcutaneous tissue: Secondary | ICD-10-CM | POA: Insufficient documentation

## 2023-07-14 DIAGNOSIS — M7712 Lateral epicondylitis, left elbow: Secondary | ICD-10-CM | POA: Diagnosis not present

## 2023-07-14 MED ORDER — KETOROLAC TROMETHAMINE 15 MG/ML IJ SOLN
15.0000 mg | Freq: Once | INTRAMUSCULAR | Status: AC
Start: 2023-07-14 — End: 2023-07-14
  Administered 2023-07-14: 15 mg via INTRAMUSCULAR
  Filled 2023-07-14: qty 1

## 2023-07-14 MED ORDER — INDOMETHACIN 50 MG PO CAPS: 50.0000 mg | ORAL_CAPSULE | Freq: Three times a day (TID) | ORAL | 0 refills | Status: AC | PRN
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
## 2023-07-14 NOTE — ED Triage Notes (Signed)
 Pt also notes he has some sort of rash to buttocks,noticed 2 months ago and has spread.
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
## 2023-07-14 NOTE — Discharge Instructions (Signed)
 You were seen for your elbow pain and drainage near your rectum in the emergency department.   At home, please continue to take Tylenol.  Take the anti-inflammatory Indocin that we have prescribed you for your elbow pain.  Do not take ibuprofen or other NSAIDs while on this medication.  You may also use topical lidocaine for your elbows or other creams such as IcyHot.  Continue topical creams for your rectum.  Check your MyChart online for the results of any tests that had not resulted by the time you left the emergency department.   Follow-up with your primary doctor in 2-3 days regarding your visit.  Follow-up with your general surgeon who performed the excision of your pilonidal cyst about the continued drainage.  Return immediately to the emergency department if you experience any of the following: Fevers, worsening pain, or any other concerning symptoms.    Thank you for visiting our Emergency Department. It was a pleasure taking care of you today.

## 2023-07-24 DIAGNOSIS — T22211D Burn of second degree of right forearm, subsequent encounter: Secondary | ICD-10-CM | POA: Diagnosis not present

## 2023-07-24 DIAGNOSIS — F515 Nightmare disorder: Secondary | ICD-10-CM | POA: Diagnosis not present

## 2023-07-24 DIAGNOSIS — T22212D Burn of second degree of left forearm, subsequent encounter: Secondary | ICD-10-CM | POA: Diagnosis not present

## 2023-07-24 DIAGNOSIS — T23201D Burn of second degree of right hand, unspecified site, subsequent encounter: Secondary | ICD-10-CM | POA: Diagnosis not present

## 2023-07-24 DIAGNOSIS — T22232D Burn of second degree of left upper arm, subsequent encounter: Secondary | ICD-10-CM | POA: Diagnosis not present

## 2023-07-24 DIAGNOSIS — T23292D Burn of second degree of multiple sites of left wrist and hand, subsequent encounter: Secondary | ICD-10-CM | POA: Diagnosis not present

## 2023-07-24 DIAGNOSIS — T22231D Burn of second degree of right upper arm, subsequent encounter: Secondary | ICD-10-CM | POA: Diagnosis not present

## 2024-02-04 ENCOUNTER — Other Ambulatory Visit: Payer: Self-pay

## 2024-02-04 DIAGNOSIS — J020 Streptococcal pharyngitis: Secondary | ICD-10-CM | POA: Diagnosis not present

## 2024-02-04 DIAGNOSIS — J029 Acute pharyngitis, unspecified: Secondary | ICD-10-CM | POA: Diagnosis present

## 2024-02-04 DIAGNOSIS — R509 Fever, unspecified: Secondary | ICD-10-CM | POA: Diagnosis not present

## 2024-02-04 LAB — GROUP A STREP BY PCR: Group A Strep by PCR: DETECTED — AB

## 2024-02-04 LAB — RESP PANEL BY RT-PCR (RSV, FLU A&B, COVID)  RVPGX2
Influenza A by PCR: NEGATIVE
Influenza B by PCR: NEGATIVE
Resp Syncytial Virus by PCR: NEGATIVE
SARS Coronavirus 2 by RT PCR: NEGATIVE

## 2024-02-04 NOTE — ED Triage Notes (Signed)
 Right ear ache. Sore throat two days. Denies cough. Minor congestion. Right tympanic membrane appears intact no drainage. Denies fevers at home, -n/-v/-d. Low grade fever in triage. Tylenol  and motrin  at home for pain.

## 2024-02-05 ENCOUNTER — Emergency Department (HOSPITAL_BASED_OUTPATIENT_CLINIC_OR_DEPARTMENT_OTHER)
Admission: EM | Admit: 2024-02-05 | Discharge: 2024-02-05 | Disposition: A | Discharge status: Home / Self Care | Attending: Emergency Medicine | Admitting: Emergency Medicine

## 2024-02-05 DIAGNOSIS — J020 Streptococcal pharyngitis: Secondary | ICD-10-CM

## 2024-02-05 MED ORDER — ACETAMINOPHEN 500 MG PO TABS
1000.0000 mg | ORAL_TABLET | Freq: Once | ORAL | Status: AC
  Administered 2024-02-05: 1000 mg via ORAL
  Filled 2024-02-05: qty 2

## 2024-02-05 MED ORDER — DEXAMETHASONE SODIUM PHOSPHATE 10 MG/ML IJ SOLN
10.0000 mg | Freq: Once | INTRAMUSCULAR | Status: AC
  Administered 2024-02-05: 10 mg
  Filled 2024-02-05: qty 1

## 2024-02-05 MED ORDER — AMOXICILLIN 500 MG PO CAPS
500.0000 mg | ORAL_CAPSULE | Freq: Once | ORAL | Status: AC
  Administered 2024-02-05: 500 mg via ORAL
  Filled 2024-02-05: qty 1

## 2024-02-05 MED ORDER — KETOROLAC TROMETHAMINE 60 MG/2ML IM SOLN
30.0000 mg | Freq: Once | INTRAMUSCULAR | Status: AC
  Administered 2024-02-05: 30 mg via INTRAMUSCULAR
  Filled 2024-02-05: qty 2

## 2024-02-05 MED ORDER — AMOXICILLIN 500 MG PO CAPS: 500.0000 mg | ORAL_CAPSULE | Freq: Two times a day (BID) | ORAL | 0 refills | Status: AC

## 2024-02-05 NOTE — ED Notes (Signed)
 Patient called upset because his prescriptions were called  into the [facility] [address]  He stated he asked for the scripts to be called in at the [facility] [address] because [place] that side of town.  I called [facility] and had them transferred and called the patient back to advise.

## 2024-02-05 NOTE — ED Provider Notes (Signed)
 [place] [facility] AT [facility] Provider Note   CSN: [id number] Arrival date & time: 02/04/24  2006     Patient presents with: Sore Throat   Phillip [name] is a 43 y.o. male.   The patient is presenting with a sore throat that has been ongoing for approximately two to 3 days. The patient reports occasional fevers but denies any history of diabetes. The patient has a history of a heart attack. The sore throat is associated with a positive strep test indicating strep throat. The patient denies any known medication allergies. The history was obtained from the patient.   Sore Throat       Prior to Admission medications   Medication Sig Start Date End Date Taking? Authorizing Provider  amoxicillin  (AMOXIL ) 500 MG capsule Take 1 capsule (500 mg total) by mouth 2 (two) times daily for 14 days. 02/05/24 02/19/24 Yes Merian Wroe, Selinda, MD  famotidine  (PEPCID ) 20 MG tablet Take 1 tablet (20 mg total) by mouth 2 (two) times daily for 7 days. 03/25/23 04/01/23  Harlin Mazzoni, Selinda, MD  fluticasone  (FLONASE ) 50 MCG/ACT nasal spray Place 1 spray into both nostrils daily for 7 days. 06/01/21 07/01/21  Elnor Jayson LABOR, DO  hydrOXYzine  (ATARAX ) 25 MG tablet Take 1 tablet (25 mg total) by mouth every 6 (six) hours as needed for anxiety. 07/19/22   Silver Wonda LABOR, PA  indomethacin  (INDOCIN ) 50 MG capsule Take 1 capsule (50 mg total) by mouth 3 (three) times daily as needed for up to 30 doses for moderate pain (pain score 4-6). 07/14/23   Yolande Lamar BROCKS, MD  ondansetron  (ZOFRAN ) 4 MG tablet Take 1 tablet (4 mg total) by mouth every 6 (six) hours as needed for nausea or vomiting. 07/19/22   Silver Wonda LABOR, PA  pantoprazole  (PROTONIX ) 40 MG tablet Take 1 tablet (40 mg total) by mouth 2 (two) times daily for 14 days. 03/25/23 04/08/23  Jersee Winiarski, Selinda, MD  sucralfate  (CARAFATE ) 1 GM/10ML suspension Take 10 mLs (1 g total) by mouth 4 (four) times daily -  with meals and at bedtime. 03/25/23    Christina Waldrop, Selinda, MD    Allergies: Patient has no known allergies.    Review of Systems  Updated Vital Signs BP (!) 141/84 (BP Location: Right Arm)   Pulse 80   Temp 99.2 F (37.3 C) (Oral)   Resp 16   SpO2 100%   Physical Exam Vitals and nursing note reviewed.  Constitutional:      Appearance: He is well-developed.  HENT:     Head: Normocephalic and atraumatic.     Comments: Posterior erythema and some exudates. Mild erythema to hard palate but no e/o mucormycosis.  Cardiovascular:     Rate and Rhythm: Normal rate.  Pulmonary:     Effort: Pulmonary effort is normal. No respiratory distress.  Abdominal:     General: There is no distension.  Musculoskeletal:        General: Normal range of motion.     Cervical back: Normal range of motion.  Skin:    General: Skin is warm and dry.  Neurological:     Mental Status: He is alert.     (all labs ordered are listed, but only abnormal results are displayed) Labs Reviewed  GROUP A STREP BY PCR - Abnormal; Notable for the following components:      Result Value   Group A Strep by PCR DETECTED (*)    All other components within normal limits  RESP PANEL  BY RT-PCR (RSV, FLU A&B, COVID)  [id number]    EKG: None  Radiology: No results found.   Procedures   Medications Ordered in the ED  ketorolac  (TORADOL ) injection 30 mg (30 mg Intramuscular Given 02/05/24 0109)  acetaminophen  (TYLENOL ) tablet 1,000 mg (1,000 mg Oral Given 02/05/24 0107)  dexamethasone  (DECADRON ) injection 10 mg (10 mg Other Given 02/05/24 0110)  amoxicillin  (AMOXIL ) capsule 500 mg (500 mg Oral Given 02/05/24 0107)                                    Medical Decision Making Risk OTC drugs. Prescription drug management.   Strep positive. Bicillin on backorder so switched to augmentin . Decadron /toradol  for symptom relief. No e/o severe complication.      Final diagnoses:  Strep throat    ED Discharge Orders          Ordered    amoxicillin   (AMOXIL ) 500 MG capsule  2 times daily        02/05/24 0056               Eldean Nanna, Selinda, MD 02/05/24 [phone]

## 2024-02-06 ENCOUNTER — Other Ambulatory Visit (HOSPITAL_BASED_OUTPATIENT_CLINIC_OR_DEPARTMENT_OTHER): Payer: Self-pay

## 2024-02-06 ENCOUNTER — Emergency Department (HOSPITAL_BASED_OUTPATIENT_CLINIC_OR_DEPARTMENT_OTHER)
Admission: EM | Admit: 2024-02-06 | Discharge: 2024-02-06 | Disposition: A | Discharge status: Home / Self Care | Attending: Emergency Medicine | Admitting: Emergency Medicine

## 2024-02-06 ENCOUNTER — Other Ambulatory Visit: Payer: Self-pay

## 2024-02-06 DIAGNOSIS — J029 Acute pharyngitis, unspecified: Secondary | ICD-10-CM | POA: Diagnosis present

## 2024-02-06 DIAGNOSIS — J020 Streptococcal pharyngitis: Secondary | ICD-10-CM | POA: Insufficient documentation

## 2024-02-06 LAB — CBC WITH DIFFERENTIAL/PLATELET
Abs Immature Granulocytes: 0.03 K/uL (ref 0.00–0.07)
Basophils Absolute: 0 K/uL (ref 0.0–0.1)
Basophils Relative: 0 %
Eosinophils Absolute: 0 K/uL (ref 0.0–0.5)
Eosinophils Relative: 0 %
HCT: 40.8 % (ref 39.0–52.0)
Hemoglobin: 13.5 g/dL (ref 13.0–17.0)
Immature Granulocytes: 0 %
Lymphocytes Relative: 21 %
Lymphs Abs: 2.4 K/uL (ref 0.7–4.0)
MCH: 31.8 pg (ref 26.0–34.0)
MCHC: 33.1 g/dL (ref 30.0–36.0)
MCV: 96 fL (ref 80.0–100.0)
Monocytes Absolute: 0.6 K/uL (ref 0.1–1.0)
Monocytes Relative: 6 %
Neutro Abs: 8.4 K/uL — ABNORMAL HIGH (ref 1.7–7.7)
Neutrophils Relative %: 73 %
Platelets: 288 K/uL (ref 150–400)
RBC: 4.25 MIL/uL (ref 4.22–5.81)
RDW: 12.5 % (ref 11.5–15.5)
WBC: 11.5 K/uL — ABNORMAL HIGH (ref 4.0–10.5)
nRBC: 0 % (ref 0.0–0.2)

## 2024-02-06 LAB — COMPREHENSIVE METABOLIC PANEL WITH GFR
ALT: 8 U/L (ref 0–44)
AST: 15 U/L (ref 15–41)
Albumin: 4.2 g/dL (ref 3.5–5.0)
Alkaline Phosphatase: 89 U/L (ref 38–126)
Anion gap: 9 (ref 5–15)
BUN: 11 mg/dL (ref 6–20)
CO2: 31 mmol/L (ref 22–32)
Calcium: 9.7 mg/dL (ref 8.9–10.3)
Chloride: 103 mmol/L (ref 98–111)
Creatinine, Ser: 0.99 mg/dL (ref 0.61–1.24)
GFR, Estimated: >60 mL/min (ref >60)
Glucose, Bld: 99 mg/dL (ref 70–99)
Potassium: 3.9 mmol/L (ref 3.5–5.1)
Sodium: 143 mmol/L (ref 135–145)
Total Bilirubin: 0.3 mg/dL (ref 0.0–1.2)
Total Protein: 8.1 g/dL (ref 6.5–8.1)

## 2024-02-06 MED ORDER — METHYLPREDNISOLONE 4 MG PO TBPK
ORAL_TABLET | ORAL | 0 refills | Status: AC
  Filled 2024-02-06: qty 21, 6d supply, fill #0

## 2024-02-06 NOTE — Discharge Instructions (Signed)
 Take Medrol  Dosepak as prescribed take your first dose when you get home.  Continue antibiotics.  Recommend 1000 mg of Tylenol  every 6 hours as needed for pain

## 2024-02-06 NOTE — ED Provider Notes (Signed)
 [place] EMERGENCY DEPARTMENT AT [facility] Provider Note   CSN: [id number] Arrival date & time: 02/06/24  1329     Patient presents with: Sore Throat   Phillip [name] is a 43 y.o. male.   Patient here with ongoing pain in his throat from strep throat that was diagnosed yesterday.  He is taking 2 doses of antibiotics had a Decadron  shot yesterday.  He states pain was too much for him to go to work.  He denies any major difficulty eating or drinking but does have discomfort.  He has no difficulty opening mouth.  No drooling.  No fever.  The history is provided by the patient.       Prior to Admission medications   Medication Sig Start Date End Date Taking? Authorizing Provider  methylPREDNISolone  (MEDROL  DOSEPAK) 4 MG TBPK tablet Follow package insert 02/06/24  Yes Griffey Nicasio, DO  amoxicillin  (AMOXIL ) 500 MG capsule Take 1 capsule (500 mg total) by mouth 2 (two) times daily for 14 days. 02/05/24 02/19/24  Mesner, Selinda, MD  famotidine  (PEPCID ) 20 MG tablet Take 1 tablet (20 mg total) by mouth 2 (two) times daily for 7 days. 03/25/23 04/01/23  Mesner, Selinda, MD  fluticasone  (FLONASE ) 50 MCG/ACT nasal spray Place 1 spray into both nostrils daily for 7 days. 06/01/21 07/01/21  Elnor Jayson LABOR, DO  hydrOXYzine  (ATARAX ) 25 MG tablet Take 1 tablet (25 mg total) by mouth every 6 (six) hours as needed for anxiety. 07/19/22   Silver Wonda LABOR, PA  indomethacin  (INDOCIN ) 50 MG capsule Take 1 capsule (50 mg total) by mouth 3 (three) times daily as needed for up to 30 doses for moderate pain (pain score 4-6). 07/14/23   Yolande Lamar BROCKS, MD  ondansetron  (ZOFRAN ) 4 MG tablet Take 1 tablet (4 mg total) by mouth every 6 (six) hours as needed for nausea or vomiting. 07/19/22   Silver Wonda LABOR, PA  pantoprazole  (PROTONIX ) 40 MG tablet Take 1 tablet (40 mg total) by mouth 2 (two) times daily for 14 days. 03/25/23 04/08/23  Mesner, Selinda, MD  sucralfate  (CARAFATE ) 1 GM/10ML suspension Take 10 mLs  (1 g total) by mouth 4 (four) times daily -  with meals and at bedtime. 03/25/23   Mesner, Selinda, MD    Allergies: Patient has no known allergies.    Review of Systems  Updated Vital Signs BP 138/83 (BP Location: Right Arm)   Pulse 63   Temp 98.9 F (37.2 C)   Resp 17   Ht 6' 1 (1.854 m)   Wt 83.9 kg   SpO2 100%   BMI 24.41 kg/m   Physical Exam Vitals and nursing note reviewed.  Constitutional:      General: He is not in acute distress.    Appearance: He is well-developed.  HENT:     Head: Normocephalic and atraumatic.     Right Ear: Tympanic membrane normal.     Left Ear: Tympanic membrane normal.     Nose: No congestion.     Mouth/Throat:     Pharynx: Posterior oropharyngeal erythema present.     Comments: Oropharynx with erythema there is no trismus drooling or any signs of a peritonsillar or tonsillar abscess at this time uvula is midline without any major swelling Eyes:     Conjunctiva/sclera: Conjunctivae normal.  Cardiovascular:     Rate and Rhythm: Normal rate and regular rhythm.     Heart sounds: No murmur heard. Pulmonary:     Effort: Pulmonary effort is  normal. No respiratory distress.     Breath sounds: Normal breath sounds.  Abdominal:     Palpations: Abdomen is soft.     Tenderness: There is no abdominal tenderness.  Musculoskeletal:        General: No swelling.     Cervical back: Neck supple.  Skin:    General: Skin is warm and dry.     Capillary Refill: Capillary refill takes less than 2 seconds.  Neurological:     Mental Status: He is alert.  Psychiatric:        Mood and Affect: Mood normal.     (all labs ordered are listed, but only abnormal results are displayed) Labs Reviewed  CBC WITH DIFFERENTIAL/PLATELET - Abnormal; Notable for the following components:      Result Value   WBC 11.5 (*)    Neutro Abs 8.4 (*)    All other components within normal limits  COMPREHENSIVE METABOLIC PANEL WITH GFR    EKG: None  Radiology: No  results found.   Procedures   Medications Ordered in the ED - No data to display                                  Medical Decision Making Amount and/or Complexity of Data Reviewed Labs: ordered.  Risk Prescription drug management.   Phillip [name] is here with ongoing sore throat.  Diagnosis strep throat yesterday.  He is taking 2 doses of antibiotics.  He had a dose of Decadron  yesterday.  On exam he has no trismus no drooling no submandibular swelling.  He has erythema to bilateral tonsils little bit worse on the right than the left.  However I do not appreciate any abscess or tonsillar swelling or uvula swelling.  He was not able to go to work secondary to the pain.  Ultimately I think he still pretty early on in his course of treatment.  Will prescribe him Medrol  Dosepak to further help with discomfort and pain.  Recommend Tylenol  as well.  I do not think he needs any imaging or further workup at this time but he was educated about watching for any abscess development.  He understands return precautions.  Discharged in good condition.  This chart was dictated using voice recognition software.  Despite best efforts to proofread,  errors can occur which can change the documentation meaning.      Final diagnoses:  Strep throat    ED Discharge Orders          Ordered    methylPREDNISolone  (MEDROL  DOSEPAK) 4 MG TBPK tablet        02/06/24 1719               Susana Gripp, DO 02/06/24 1722

## 2024-02-06 NOTE — ED Triage Notes (Signed)
 Pt POV reporting persistent sore throat r/t strep throat, seen Monday for same, taking abx with minimal improvement. Denies SOB.

## 2024-05-01 ENCOUNTER — Encounter (HOSPITAL_COMMUNITY): Payer: Self-pay

## 2024-05-01 ENCOUNTER — Telehealth (HOSPITAL_COMMUNITY): Payer: Self-pay | Admitting: Emergency Medicine

## 2024-05-01 ENCOUNTER — Ambulatory Visit (HOSPITAL_COMMUNITY)
Admission: EM | Admit: 2024-05-01 | Discharge: 2024-05-01 | Disposition: A | Discharge status: Home / Self Care | Source: Home / Self Care

## 2024-05-01 DIAGNOSIS — R110 Nausea: Secondary | ICD-10-CM

## 2024-05-01 DIAGNOSIS — J069 Acute upper respiratory infection, unspecified: Secondary | ICD-10-CM | POA: Diagnosis not present

## 2024-05-01 MED ORDER — GUAIFENESIN ER 1200 MG PO TB12: 1200.0000 mg | ORAL_TABLET | Freq: Two times a day (BID) | ORAL | 0 refills | Status: AC

## 2024-05-01 MED ORDER — GUAIFENESIN ER 1200 MG PO TB12: 1200.0000 mg | ORAL_TABLET | Freq: Two times a day (BID) | ORAL | 0 refills | Status: DC

## 2024-05-01 MED ORDER — ONDANSETRON 4 MG PO TBDP: 4.0000 mg | ORAL_TABLET | Freq: Three times a day (TID) | ORAL | 0 refills | Status: AC | PRN

## 2024-05-01 NOTE — ED Triage Notes (Signed)
 Patient reports that he has had a cough, chills, and body aches x 2 days.  Patient states he has been taking Nyquil, Ibuprofen , and Percoet that he got from a neighbor.

## 2024-05-01 NOTE — Telephone Encounter (Signed)
Pt requested different pharmacy

## 2024-05-01 NOTE — Discharge Instructions (Signed)
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
## 2024-05-01 NOTE — ED Notes (Signed)
 Pt refused to be tested for Covid/flu that provider has ordered. Rosaline, NP made aware.
